# Patient Record
Sex: Female | Born: 1965
Health system: Southern US, Community
[De-identification: ages and names within clinical notes are randomized; demographics above are authoritative.]

## PROBLEM LIST (undated history)

## (undated) DIAGNOSIS — Z9889 Other specified postprocedural states: Secondary | ICD-10-CM

## (undated) DIAGNOSIS — I10 Essential (primary) hypertension: Secondary | ICD-10-CM

## (undated) DIAGNOSIS — W57XXXA Bitten or stung by nonvenomous insect and other nonvenomous arthropods, initial encounter: Secondary | ICD-10-CM

## (undated) DIAGNOSIS — E7800 Pure hypercholesterolemia, unspecified: Secondary | ICD-10-CM

## (undated) DIAGNOSIS — R600 Localized edema: Secondary | ICD-10-CM

## (undated) DIAGNOSIS — R112 Nausea with vomiting, unspecified: Secondary | ICD-10-CM

## (undated) DIAGNOSIS — E782 Mixed hyperlipidemia: Principal | ICD-10-CM

## (undated) DIAGNOSIS — K5900 Constipation, unspecified: Secondary | ICD-10-CM

## (undated) DIAGNOSIS — E559 Vitamin D deficiency, unspecified: Secondary | ICD-10-CM

## (undated) DIAGNOSIS — E669 Obesity, unspecified: Secondary | ICD-10-CM

## (undated) DIAGNOSIS — B029 Zoster without complications: Secondary | ICD-10-CM

## (undated) DIAGNOSIS — F419 Anxiety disorder, unspecified: Secondary | ICD-10-CM

## (undated) DIAGNOSIS — R7309 Other abnormal glucose: Secondary | ICD-10-CM

## (undated) DIAGNOSIS — L719 Rosacea, unspecified: Secondary | ICD-10-CM

## (undated) DIAGNOSIS — F99 Mental disorder, not otherwise specified: Secondary | ICD-10-CM

## (undated) HISTORY — DX: Anxiety disorder, unspecified: F41.9

## (undated) HISTORY — DX: Vitamin D deficiency, unspecified: E55.9

## (undated) HISTORY — DX: Mental disorder, not otherwise specified: F99

## (undated) HISTORY — DX: Obesity, unspecified: E66.9

## (undated) HISTORY — PX: CERVICAL DISCECTOMY: SHX98

## (undated) HISTORY — PX: BUNIONECTOMY: SHX129

## (undated) HISTORY — PX: ABDOMINAL HYSTERECTOMY: SHX81

## (undated) HISTORY — DX: Bitten or stung by nonvenomous insect and other nonvenomous arthropods, initial encounter: W57.XXXA

## (undated) HISTORY — DX: Other abnormal glucose: R73.09

## (undated) HISTORY — DX: Constipation, unspecified: K59.00

## (undated) HISTORY — PX: OTHER SURGICAL HISTORY: SHX169

## (undated) HISTORY — DX: Rosacea, unspecified: L71.9

## (undated) HISTORY — DX: Zoster without complications: B02.9

## (undated) HISTORY — DX: Pure hypercholesterolemia, unspecified: E78.00

## (undated) HISTORY — DX: Mixed hyperlipidemia: E78.2

## (undated) HISTORY — PX: COLONOSCOPY: SHX174

## (undated) HISTORY — PX: LUMBAR DISC SURGERY: SHX700

---

## 2002-03-08 ENCOUNTER — Ambulatory Visit (HOSPITAL_COMMUNITY): Admission: RE | Admit: 2002-03-08 | Discharge: 2002-03-08 | Payer: Self-pay | Admitting: Obstetrics and Gynecology

## 2002-03-08 ENCOUNTER — Encounter: Payer: Self-pay | Admitting: Obstetrics and Gynecology

## 2002-03-12 ENCOUNTER — Inpatient Hospital Stay (HOSPITAL_COMMUNITY): Admission: RE | Admit: 2002-03-12 | Discharge: 2002-03-14 | Payer: Self-pay | Admitting: Obstetrics and Gynecology

## 2003-03-27 ENCOUNTER — Ambulatory Visit (HOSPITAL_COMMUNITY): Admission: RE | Admit: 2003-03-27 | Discharge: 2003-03-27 | Payer: Self-pay | Admitting: Obstetrics and Gynecology

## 2003-03-27 ENCOUNTER — Encounter: Payer: Self-pay | Admitting: Obstetrics and Gynecology

## 2004-03-24 ENCOUNTER — Ambulatory Visit (HOSPITAL_COMMUNITY): Admission: RE | Admit: 2004-03-24 | Discharge: 2004-03-24 | Payer: Self-pay | Admitting: Obstetrics and Gynecology

## 2005-03-01 ENCOUNTER — Emergency Department (HOSPITAL_COMMUNITY): Admission: EM | Admit: 2005-03-01 | Discharge: 2005-03-01 | Payer: Self-pay | Admitting: Emergency Medicine

## 2005-03-08 ENCOUNTER — Ambulatory Visit (HOSPITAL_COMMUNITY): Admission: RE | Admit: 2005-03-08 | Discharge: 2005-03-09 | Payer: Self-pay | Admitting: Neurosurgery

## 2005-04-14 ENCOUNTER — Ambulatory Visit (HOSPITAL_COMMUNITY): Admission: RE | Admit: 2005-04-14 | Discharge: 2005-04-14 | Payer: Self-pay | Admitting: Obstetrics and Gynecology

## 2006-04-15 ENCOUNTER — Ambulatory Visit (HOSPITAL_COMMUNITY): Admission: RE | Admit: 2006-04-15 | Discharge: 2006-04-15 | Payer: Self-pay | Admitting: Obstetrics and Gynecology

## 2007-10-23 ENCOUNTER — Ambulatory Visit (HOSPITAL_COMMUNITY): Admission: RE | Admit: 2007-10-23 | Discharge: 2007-10-23 | Payer: Self-pay | Admitting: Obstetrics and Gynecology

## 2008-10-28 ENCOUNTER — Ambulatory Visit (HOSPITAL_COMMUNITY): Admission: RE | Admit: 2008-10-28 | Discharge: 2008-10-28 | Payer: Self-pay | Admitting: Obstetrics and Gynecology

## 2010-11-21 ENCOUNTER — Encounter: Payer: Self-pay | Admitting: Obstetrics & Gynecology

## 2010-11-22 ENCOUNTER — Encounter: Payer: Self-pay | Admitting: Obstetrics and Gynecology

## 2011-03-19 NOTE — H&P (Signed)
[facility]  Patient:    Alicia Gross, FISCHLER Visit Number: [id number] MRN: [id number]          Service Type: OUT Location: RAD Attending Physician:  Tilda Burrow Dictated by:   Christin Bach, M.D. Admit Date:  03/08/2002   CC:         [name], M.D.,  [facility]   History and Physical  ADMITTING DIAGNOSIS:  Bilateral ovarian cysts with enlarged left ovary 9 cm diameter with internal septations, rule out ovarian malignancy, rule out endometriosis.  HISTORY OF PRESENT ILLNESS:  This 45 year old female, gravida 2, para 2, status post tubal ligation, status post cystectomy in 1987, is admitted at this time for abdominal hysterectomy and bilateral removal of ovaries for bilateral ovarian tumors.  The patient was seen for her usual annual visit on Mar 06, 2002, where complaints of several weeks of lower abdominal and pelvic pressure were evaluated.  She was found on exam to have fullness in the adnexa as well as right adnexal tenderness.  Vaginal probe ultrasound shows a 9 x 10-cm left ovarian cyst.  She also complained of loss of urine with coughing, sneezing and laughing.  A CA 125 was ordered and is at the upperlimits of normal at 27 (normal 0 to 30).  CT of the abdomen shows a left ovarian cyst with two internal septations, 6.4 x 8.2 x 9.0 cm by CT with no nodularities identified.  The right ovary has a simple cyst 3.6 cm.  There is no adenopathy and no ascites.  There is a small amount of pelvic fluid considered within normal limits.  Upper abdomen is similarly negative on CT. The patient has been made fully aware that frozen section will be necessary to evaluate the potential for malignancy in the ovaries.  The potential for need for additional surgeries if cancer is identified has been discussed at length. The patient is fully aware of risks and declines the option of referral to tertiary centers or GYN oncology for this  work.  Additionally, the patient has been evaluated this week for the problems of stress incontinence and has been found to have documentable stress incontinence symptomatology by Dr. Rito Ehrlich.  The plans are to do a Burch urethropexy at the time of the surgery; this will be performed as a concurrent procedure.  FURTHER REVIEW OF SYSTEMS:  Includes mild cystic acne of the face recently treated with doxycycline, diuretic p.r.n. dependent edema chronically used.  ALLERGIES:  None known.  SURGICAL HISTORY: 1. Exploratory laparotomy while pregnancy in 1987 for a right ovarian    cystectomy. 2. In 1996 bunionectomy, right foot.  MEDICATIONS: 1. Lasix p.r.n. 2. Tetracycline. 3. Lortab or Lorabid p.r.n. discomfort. 4. Prempro at menses to reduce flow.  LABORATORY DATA:  Pap smear class I.  PHYSICAL EXAMINATION:  GENERAL:  Shows a healthy Caucasian female alert and oriented x3.  VITAL SIGNS:  Weight 222.  Blood pressure 118/80, pulse 70.  HEENT:  Within normal limits.  Oral hygiene present.  NECK:  Trachea midline, thyroid palpates as normal.  BREAST EXAM:  No masses, dimpling or retraction.  CARDIOVASCULAR EXAM:  Regular rate and rhythm.  LUNGS:  Clear to auscultation.  ABDOMEN:  Flat, soft, nontender, midline vertical lower abdominal scar with stretch marks and laxity from childbearing.  MUSCULOSKELETAL:  No obvious joint deformities.  EXTREMITIES:  Within normal limits, 2+ DTRs.  GENITOURINARY:  External genitalia normal to appearance with the anterior urethra prominent beneath the symphysis  pubis.  There is general adnexal tenderness with vaginal probe ultrasound and CT as mentioned in the HPI.  Loss of urine with coughing or sneezing, laughing not reproducible but consistent on patient history.  PLAN: 1. Laparotomy. 2. Wide excision of abdominal cicatrix. 3. Abdominal hysterectomy. 4. Probable bilateral salpingo-oophorectomy with frozen section of left     ovary. 5. Burch urethropexy at time of procedure. 6. If frozen section suggests a malignancy, omentectomy and additional    biopsies as indicated. Dictated by:   Christin Bach, M.D. Attending Physician:  Tilda Burrow DD:  03/09/02 TD:  03/09/02 Job: [id number]

## 2011-03-19 NOTE — Op Note (Signed)
NAMEAINA, ROSSBACH NO.:  [id number]   MEDICAL RECORD NO.:  [id number]          PATIENT TYPE:  OIB   LOCATION:  3036                         FACILITY:  [facility]   PHYSICIAN:  Cristi Loron, M.D.DATE OF BIRTH:  1966/10/07   DATE OF PROCEDURE:  03/08/2005  DATE OF DISCHARGE:                                 OPERATIVE REPORT   BRIEF HISTORY:  The patient is a 45 year old white female who suffers from  neck and left arm pain.  She has failed medical management and was worked up  with a cervical MRI which demonstrated a large herniated disc at C6-C7 on  the left as well as significant spondylosis and stenosis at C5-C6.  I  discussed the various treatment options with the patient including surgery.  The patient has weighed the risks, benefits, and alternatives of surgery and  decided to proceed with a C5-C6 and C6-C7 anterior cervical discectomy,  fusion, and plating.   PREOPERATIVE DIAGNOSIS:  C5-C6 spondylosis and stenosis, C6-C7 herniated  nucleus pulposus, cervical radiculopathy, cervicalgia.   POSTOPERATIVE DIAGNOSIS:  C5-C6 spondylosis and stenosis, C6-C7 herniated  nucleus pulposus, cervical radiculopathy, cervicalgia.   PROCEDURE:  C5-C6 and C6-C7 extensive anterior cervical  discectomy/decompression; interbody iliac crest allograft arthrodesis;  anterior cervical plating C5 through C7 (Codman Slimlock titanium plate and  screws).   SURGEON:  Cristi Loron, M.D.   ASSISTANT:  Hewitt Shorts, M.D.   ANESTHESIA:  General endotracheal anesthesia.   ESTIMATED BLOOD LOSS:  100 mL.   SPECIMENS:  None.   DRAINS:  None.   COMPLICATIONS:  None.   DESCRIPTION OF PROCEDURE:  The patient was brought to the operating room by  the anesthesia team.  General endotracheal anesthesia was induced.  The  patient's neck was then prepared with Betadine scrub and Betadine solution.  Sterile drapes were applied.  I then injected the area to be incised with  Marcaine with epinephrine solution.  I used a scalpel to make a transverse  incision in the patient's left anterior neck.  I used the Metzenbaum  scissors to divide the platysmal muscle and to dissect medial to the  sternocleidomastoid muscle, jugular vein, and carotid artery.  I bluntly  dissected down towards the anterior cervical spine, carefully identified the  esophagus and retracted it medially.  I used Kitner swabs to clear soft  tissue from the anterior cervical spine and then inserted a bent spinal  needle into the upper exposed interspace.  I obtained an interoperative  radiograph to confirm our location.   We then used electrocautery to detach the medial border of the longus colli  muscles bilaterally from C5-C6 and C6-C7 intervertebral disc space.  We  inserted the Caspar self-retaining retractor for exposure.  We began at C6-  C7.  We incised the C6-C7 intervertebral disc and performed a partial  discectomy using Carlens curets and pituitary forceps.  We then inserted  distraction screws at C6 and C7 and distracted the interspace.  We used the  high speed drill to decorticate the vertebral endplates at C6-C7, drill away  the remainder of  the C6-C7 intervertebral disc, and then drilled away the  posterior spondylosis.  I then used the arachnoid knife to incise the  thinned out posterior longitudinal ligament.  I then removed it with the  Kerrison punch under cutting the vertebral endplate at C6-C7, decompressing  the thecal sac.  I then performed a foraminotomy about the bilateral C7  nerve roots.  Of note, on the left side, there was a moderately large  herniated disc compressing the left C7 nerve root.  We decompressed both  nerve roots well.   We then repeated the procedure in an analogous fashion at C5-C6  decompressing the thecal sac and bilateral C6 nerve root.  The only  difference at this level was that there was more spondylosis.   Having completed the  decompressions at C5-C6 and C6-C7, we now turned our  attention to the arthrodesis.  We obtained iliac crest tricortical allograft  bone graft and fashioned it to these approximate dimensions, 7 mm in height,  1 cm in depth, inserted the bone graft and distracted the C5-C6 interspace  and removed the distraction screws from C5, placed it back into C7,  distracted the C6-C7 interspace, and placed a second bone graft in that  interspace.  We removed the distraction screws and there was a good snug fit  of bone at both levels.   We now turned our attention to the anterior spine instrumentation.  We used  a high speed drill to remove some ventral spondylosis from the vertebral  endplates of C5-C6 and C6-C7 so that the plate would lie flat.  We selected  the appropriate length Codman Slimlock titanium plate, we laid it on the  anterior aspect of the vertebral bodies from C5 to C7, we drilled two 12 mm  holes at C5, C6, and C7.  We secured the plate to the vertebral bodies by  placing two 12 mm self-tapping screws at C6, C5, and C7.  We obtained an  interoperative radiograph.  We could not see the plate and screws well  because of the patient's body habitus, but they looked good en vivo.  We  secured the screws to the plate by locking each cam.   We then obtained stringent hemostasis using bipolar electrocautery.  We then  removed the Caspar self-retaining retractor.  We inspected the esophagus for  any damage and there was none apparent.  We then reapproximated the  patient's platysmal muscle with interrupted 3-0 Vicryl suture, the  subcutaneous tissue with interrupted 3-0 Vicryl suture, and the skin with  Steri-Strips and Benzoin.  The wound was then coated with Bacitracin  ointment.  A sterile dressing was applied, the drapes were removed.  The  patient was subsequently extubated by the anesthesia team and transported to the post anesthesia care unit in stable condition.  All sponge,  instrument  and needle counts were correct at the end of the case.      JDJ/MEDQ  D:  03/08/2005  T:  03/08/2005  Job:  [id number]

## 2011-03-19 NOTE — Op Note (Signed)
[facility]  Patient:    Alicia Gross, Alicia Gross Visit Number: [id number] MRN: [id number]          Service Type: MED Location: 4A A417 01 Attending Physician:  Tilda Burrow Dictated by:   Dennie Maizes, M.D. Proc. Date: 03/12/02 Admit Date:  03/12/2002 Discharge Date: 03/14/2002   CC:         Christin Bach, M.D.   Operative Report  PREOPERATIVE DIAGNOSES:   Stress urinary incontinence.  POSTOPERATIVE DIAGNOSES: Stress urinary incontinence.  OPERATION:  Burch sacrourethropexy.  SURGEON:  Dennie Maizes, M.D.  ASSISTANT:  Christin Bach, M.D.  ANESTHESIA:  General.  COMPLICATIONS:  None.  DRAINS:  32-French triple lumen Foley catheter with 30 cc balloon in the bladder.  INDICATIONS:  This 45 year old female with excessive menstrual periods is scheduled to undergo total abdominal hysterectomy with bilateral salpingo-oophorectomy.  She had significant stress urinary incontinence. After urological evaluation, she was scheduled to undergo urethropexy under the same anesthesia.  DESCRIPTION OF PROCEDURE:  General anesthesia was induced, and the patient was placed on the OR table in the supine position.  Her legs were kept in a frog leg position for access to the vagina.  The abdomen and genitalia were prepped and draped in a sterile fashion.  A 22-French triple lumen Foley catheter with a 30 cc balloon was then inserted into the bladder under sterile precautions.  Through a midline abdominal incision, Dr. Emelda Fear proceeded with total abdominal hysterectomy and bilateral salpingo-oophorectomy.  The peritoneum was closed after the procedure.  The presacral space was then entered and the bladder was mobilized in anterior as well as lateral aspects.  The ______  was placed in the vagina, and the lateral vaginal wall was elevated.  By palpating the Foley balloon, the edges of the bladder were deferred.  The sutures were placed on the right  side first.  The lateral part of the bladder was deferred and rolled medially. Adipose tissue over the endopelvic fascia was then teased and removed.  The anterolateral vaginal wall was then approximated to the Coopers ligament using a 0 Prolene suture.  A double bite was taken over the vaginal wall and endopelvic fascia.  A second suture was placed on the left side in a similar fashion.  Elevation of both sutures resulted in lengthening of the urethra and elevation of the bladder base.  The sutures were then tied snugly with elevation of the vaginal wall.  There was no active bleeding at this time. The abdominal incision was then closed by Dr. Emelda Fear. Dictated by:   Dennie Maizes, M.D. Attending Physician:  Tilda Burrow DD:  03/13/02 TD:  03/14/02 Job: [id number]

## 2011-03-19 NOTE — Discharge Summary (Signed)
[facility]  Patient:    Alicia Gross, Alicia Gross Visit Number: [id number] MRN: [id number]          Service Type: MED Location: 4A A417 01 Attending Physician:  Tilda Burrow Dictated by:   Christin Bach, M.D. Admit Date:  03/12/2002 Discharge Date: 03/14/2002                             Discharge Summary  ADMITTING DIAGNOSES: 1. Bilateral ovarian cyst. 2. Rule out ovarian malignancy. 3. Rule out endometriosis. 4. Stress urinary incontinence.  DISCHARGE DIAGNOSES: 1. Endometriosis with bilateral endometriomas. 2. Stress urinary incontinence.  PROCEDURES: 1. Total abdominal hysterectomy, bilateral salpingo-oophorectomy on Mar 12, 2002. 2. Burch urethropexy on Mar 12, 2002.  DISCHARGE MEDICATIONS: 1. Tylox one q.4 h. p.r.n. pain, 20 tablets. 2. Motrin 800 mg one q.8 h. for pain routinely, 30 tablets. 3. Cipro 500 mg twice daily x1 week. 4. Estraderm 0.1 mg patches use weekly.  FOLLOWUP:  One week for staple removal.  HISTORY OF PRESENT ILLNESS:  This 45 year old female was admitted for exploratory laparotomy, removal of large cystic ovaries which were both symptomatic and suspicious for the presence of large cystic masses, internal, within the left ovary with CA125 within the upper limits of normal. Additionally, she had a stress incontinence and was evaluated for this preoperatively by Dr. Rito Ehrlich, and Burch urethropexy was planned as in preoperative notes. Abdominal prior cicatrix from right cystectomy in the past will be trimmed.  HOSPITAL COURSE:  The patients TAHBSO, wide excision of cicatrix, and Burch urethropexy were performed without difficulty on Mar 12, 2002. Postoperatively, the patient showed mild nausea for 24 hours with required Dilaudid PCA pump for analgesia for 48 hours. Tolerated the diet adequately to be discharged home at 48 hours. Postoperative hemoglobin 11.3 compared to 13.8 preoperatively. The patient was discharged  home on Mar 14, 2002 for followup one week. Dictated by:   Christin Bach, M.D. Attending Physician:  Tilda Burrow DD:  03/14/02 TD:  03/16/02 Job: [id number]

## 2011-08-19 ENCOUNTER — Other Ambulatory Visit: Payer: Self-pay | Admitting: Obstetrics and Gynecology

## 2011-08-19 DIAGNOSIS — Z139 Encounter for screening, unspecified: Secondary | ICD-10-CM

## 2011-08-23 ENCOUNTER — Ambulatory Visit (HOSPITAL_COMMUNITY)
Admission: RE | Admit: 2011-08-23 | Discharge: 2011-08-23 | Disposition: A | Discharge status: Home / Self Care | Payer: 59 | Source: Ambulatory Visit | Attending: Obstetrics and Gynecology | Admitting: Obstetrics and Gynecology

## 2011-08-23 DIAGNOSIS — Z139 Encounter for screening, unspecified: Secondary | ICD-10-CM

## 2011-08-23 DIAGNOSIS — Z1231 Encounter for screening mammogram for malignant neoplasm of breast: Secondary | ICD-10-CM | POA: Insufficient documentation

## 2012-10-18 ENCOUNTER — Other Ambulatory Visit: Payer: Self-pay | Admitting: Obstetrics and Gynecology

## 2012-10-18 DIAGNOSIS — Z09 Encounter for follow-up examination after completed treatment for conditions other than malignant neoplasm: Secondary | ICD-10-CM

## 2013-04-23 ENCOUNTER — Other Ambulatory Visit: Payer: Self-pay | Admitting: Adult Health

## 2013-05-29 ENCOUNTER — Other Ambulatory Visit: Payer: Self-pay | Admitting: Obstetrics and Gynecology

## 2013-05-29 DIAGNOSIS — Z139 Encounter for screening, unspecified: Secondary | ICD-10-CM

## 2013-06-04 ENCOUNTER — Ambulatory Visit (HOSPITAL_COMMUNITY)
Admission: RE | Admit: 2013-06-04 | Discharge: 2013-06-04 | Disposition: A | Discharge status: Home / Self Care | Payer: 59 | Source: Ambulatory Visit | Attending: Obstetrics and Gynecology | Admitting: Obstetrics and Gynecology

## 2013-06-04 DIAGNOSIS — Z1231 Encounter for screening mammogram for malignant neoplasm of breast: Secondary | ICD-10-CM | POA: Insufficient documentation

## 2013-06-04 DIAGNOSIS — Z139 Encounter for screening, unspecified: Secondary | ICD-10-CM

## 2013-10-22 ENCOUNTER — Other Ambulatory Visit: Payer: Self-pay | Admitting: Adult Health

## 2013-10-22 MED ORDER — HYDROCHLOROTHIAZIDE 25 MG PO TABS: 25.0000 mg | ORAL_TABLET | Freq: Every day | ORAL | Status: DC

## 2013-10-22 MED ORDER — ALPRAZOLAM 1 MG PO TABS: ORAL_TABLET | ORAL | Status: DC

## 2013-10-30 ENCOUNTER — Ambulatory Visit (INDEPENDENT_AMBULATORY_CARE_PROVIDER_SITE_OTHER): Payer: 59 | Admitting: Adult Health

## 2013-10-30 ENCOUNTER — Encounter: Payer: Self-pay | Admitting: Adult Health

## 2013-10-30 ENCOUNTER — Encounter (INDEPENDENT_AMBULATORY_CARE_PROVIDER_SITE_OTHER): Payer: Self-pay

## 2013-10-30 VITALS — BP 130/84 | HR 74 | Ht 66.75 in | Wt 224.6 lb

## 2013-10-30 DIAGNOSIS — F329 Major depressive disorder, single episode, unspecified: Secondary | ICD-10-CM | POA: Insufficient documentation

## 2013-10-30 DIAGNOSIS — Z1212 Encounter for screening for malignant neoplasm of rectum: Secondary | ICD-10-CM

## 2013-10-30 DIAGNOSIS — Z01419 Encounter for gynecological examination (general) (routine) without abnormal findings: Secondary | ICD-10-CM

## 2013-10-30 DIAGNOSIS — F419 Anxiety disorder, unspecified: Secondary | ICD-10-CM

## 2013-10-30 DIAGNOSIS — F32A Depression, unspecified: Secondary | ICD-10-CM | POA: Insufficient documentation

## 2013-10-30 DIAGNOSIS — E7800 Pure hypercholesterolemia, unspecified: Secondary | ICD-10-CM | POA: Insufficient documentation

## 2013-10-30 LAB — CBC
HCT: 41.5 % (ref 36.0–46.0)
Hemoglobin: 14.5 g/dL (ref 12.0–15.0)
MCH: 31.2 pg (ref 26.0–34.0)
MCV: 89.2 fL (ref 78.0–100.0)
Platelets: 225 10*3/uL (ref 150–400)
RBC: 4.65 MIL/uL (ref 3.87–5.11)
RDW: 13.3 % (ref 11.5–15.5)

## 2013-10-30 LAB — LIPID PANEL
Cholesterol: 130 mg/dL (ref 0–200)
HDL: 37 mg/dL — ABNORMAL LOW (ref >39)
LDL Cholesterol: 25 mg/dL (ref 0–99)

## 2013-10-30 LAB — COMPREHENSIVE METABOLIC PANEL
ALT: 18 U/L (ref 0–35)
AST: 16 U/L (ref 0–37)
Albumin: 4.6 g/dL (ref 3.5–5.2)
BUN: 15 mg/dL (ref 6–23)
Calcium: 9.9 mg/dL (ref 8.4–10.5)
Glucose, Bld: 95 mg/dL (ref 70–99)
Potassium: 4.1 mEq/L (ref 3.5–5.3)
Sodium: 138 mEq/L (ref 135–145)
Total Protein: 7 g/dL (ref 6.0–8.3)

## 2013-10-30 LAB — HEMOCCULT GUIAC POC 1CARD (OFFICE)

## 2013-10-30 NOTE — Patient Instructions (Signed)
Physical in 1 year Mammogram yearly Colonoscopy at 48 Call for labs in 48 hours

## 2013-10-30 NOTE — Progress Notes (Signed)
Patient ID: Alicia Gross, female   DOB: [birth year], 47 y.o.   MRN: [id number] History of Present Illness: Alicia Gross is a 47 year old white female,married in for a physical.She has noticed trouble focusing,has new job working at home.   Current Medications, Allergies, Past Medical History, Past Surgical History, Family History and Social History were reviewed in [employer] record.   Past Medical History  Diagnosis Date  . Constipation   . Anxiety   . Elevated cholesterol    Past Surgical History  Procedure Laterality Date  . Abdominal hysterectomy    . Bunionectomy    . Lumbar disc surgery    Current outpatient prescriptions:ALPRAZolam (XANAX) 1 MG tablet, TAKE 1/2 TO 1 TABLET BY MOUTH EVERY 8 HOURS AS NEEDED FOR ANXIETY, Disp: 60 tablet, Rfl: 2;  buPROPion (WELLBUTRIN SR) 150 MG 12 hr tablet, Take 150 mg by mouth 2 (two) times daily., Disp: , Rfl: ;  Cyanocobalamin (VITAMIN B 12 PO), Take 1,000 mg by mouth daily., Disp: , Rfl: ;  FIBER PO, Take 1,200 mg by mouth daily., Disp: , Rfl:  hydrochlorothiazide (HYDRODIURIL) 25 MG tablet, Take 1 tablet (25 mg total) by mouth daily., Disp: 90 tablet, Rfl: 4;  Multiple Vitamin (MULTIVITAMIN) tablet, Take 1 tablet by mouth daily., Disp: , Rfl: ;  Omega-3 Fatty Acids (FISH OIL PO), Take 2,000 mg by mouth daily., Disp: , Rfl: ;  OVER THE COUNTER MEDICATION, Stool softner 300 mg, Disp: , Rfl: ;  pravastatin (PRAVACHOL) 40 MG tablet, Take 40 mg by mouth daily., Disp: , Rfl:   Review of Systems: Patient denies any headaches, blurred vision, shortness of breath, chest pain, abdominal pain, problems with bowel movements, urination, or intercourse. Has constipation at times, left knee aches at times,no mood swings.See HPI.    Physical Exam:BP 130/84  Pulse 74  Ht 5' 6.75" (1.695 m)  Wt 224 lb 9.6 oz (101.878 kg)  BMI 35.46 kg/m2 General:  Well developed, well nourished, no acute distress Skin:  Warm and dry Neck:  Midline trachea,  normal thyroid Lungs; Clear to auscultation bilaterally Breast:  No dominant palpable mass, retraction, or nipple discharge Cardiovascular: Regular rate and rhythm Abdomen:  Soft, non tender, no hepatosplenomegaly Pelvic:  External genitalia is normal in appearance.  The vagina is normal in appearance, has some relaxation, cervix and uterus are absent. No adnexal masses or tenderness noted. Rectal: Good sphincter tone, no polyps,has internal hemorrhoids.  Hemoccult negative. Extremities: some swelling left leg Psych:  No mood changes,alerta and cooperative,seems happy   Impression: Yearly gyn exam no pap Elevated cholesterol Anxiety     Plan: Physical in 1 year Mammogram yearly  Check CBC,CMP,TSH and lipids Colonoscopy at 48  Follow up labs in 48 hours Try to take breaks and increase activity to see if helps with focusing, if not may try low dose adderal

## 2013-10-31 ENCOUNTER — Telehealth: Payer: Self-pay | Admitting: Adult Health

## 2013-10-31 NOTE — Telephone Encounter (Signed)
Left message to call about labs, trig elevated,

## 2013-11-02 ENCOUNTER — Other Ambulatory Visit: Payer: Self-pay | Admitting: Adult Health

## 2013-11-02 MED ORDER — FENOFIBRATE 48 MG PO TABS: 48.0000 mg | ORAL_TABLET | Freq: Every day | ORAL | Status: DC

## 2013-11-02 MED ORDER — FENOFIBRATE 54 MG PO TABS: 54.0000 mg | ORAL_TABLET | Freq: Every day | ORAL | Status: DC

## 2013-12-28 ENCOUNTER — Other Ambulatory Visit: Payer: 59

## 2014-02-07 ENCOUNTER — Other Ambulatory Visit: Payer: Self-pay | Admitting: Adult Health

## 2014-02-07 MED ORDER — CYCLOBENZAPRINE HCL 10 MG PO TABS: 10.0000 mg | ORAL_TABLET | Freq: Three times a day (TID) | ORAL | Status: DC | PRN

## 2014-02-07 NOTE — Telephone Encounter (Signed)
Having back spasms will rx flexeril

## 2014-02-08 ENCOUNTER — Other Ambulatory Visit: Payer: 59

## 2014-02-08 DIAGNOSIS — E782 Mixed hyperlipidemia: Secondary | ICD-10-CM

## 2014-02-08 DIAGNOSIS — Z0000 Encounter for general adult medical examination without abnormal findings: Secondary | ICD-10-CM

## 2014-02-08 LAB — LIPID PANEL
Cholesterol: 135 mg/dL (ref 0–200)
HDL: 34 mg/dL — ABNORMAL LOW (ref >39)
LDL Cholesterol: 52 mg/dL (ref 0–99)
Total CHOL/HDL Ratio: 4 Ratio
Triglycerides: 244 mg/dL — ABNORMAL HIGH (ref <150)
VLDL: 49 mg/dL — ABNORMAL HIGH (ref 0–40)

## 2014-02-08 LAB — COMPREHENSIVE METABOLIC PANEL
ALT: 21 U/L (ref 0–35)
AST: 18 U/L (ref 0–37)
Albumin: 4.2 g/dL (ref 3.5–5.2)
Alkaline Phosphatase: 72 U/L (ref 39–117)
BILIRUBIN TOTAL: 0.5 mg/dL (ref 0.2–1.2)
BUN: 12 mg/dL (ref 6–23)
CHLORIDE: 99 meq/L (ref 96–112)
CO2: 27 mEq/L (ref 19–32)
CREATININE: 0.77 mg/dL (ref 0.50–1.10)
Calcium: 9.8 mg/dL (ref 8.4–10.5)
GLUCOSE: 95 mg/dL (ref 70–99)
Potassium: 4 mEq/L (ref 3.5–5.3)
SODIUM: 140 meq/L (ref 135–145)
TOTAL PROTEIN: 6.8 g/dL (ref 6.0–8.3)

## 2014-02-08 NOTE — Addendum Note (Signed)
Addended by: Colen DarlingYOUNG, JANET S on: 02/08/2014 09:26 AM   Modules accepted: Orders

## 2014-02-12 ENCOUNTER — Telehealth: Payer: Self-pay | Admitting: Adult Health

## 2014-02-12 NOTE — Telephone Encounter (Signed)
Left message to call about labs 

## 2014-02-13 ENCOUNTER — Telehealth: Payer: Self-pay | Admitting: Adult Health

## 2014-02-13 NOTE — Telephone Encounter (Signed)
Pt aware of labs, continue meds,take 3 gms krell oil in divided dose and increase activity and NO carbs and recheck labs in 12 weeks,had discussed with Dr Despina HiddenEure

## 2014-03-13 ENCOUNTER — Other Ambulatory Visit: Payer: Self-pay | Admitting: Adult Health

## 2014-04-11 ENCOUNTER — Other Ambulatory Visit: Payer: Self-pay | Admitting: Adult Health

## 2014-07-12 ENCOUNTER — Other Ambulatory Visit: Payer: Self-pay | Admitting: Adult Health

## 2014-07-12 ENCOUNTER — Telehealth: Payer: Self-pay | Admitting: Adult Health

## 2014-07-12 NOTE — Telephone Encounter (Signed)
Has had swelling in both legs, had injury right knee in past and it swells, increase HCTZ to 50 mg for 5-6 days if not better make appt,has gained weight will feel Short of breath at times.

## 2014-07-25 ENCOUNTER — Other Ambulatory Visit: Payer: Self-pay | Admitting: Obstetrics and Gynecology

## 2014-07-25 DIAGNOSIS — Z1231 Encounter for screening mammogram for malignant neoplasm of breast: Secondary | ICD-10-CM

## 2014-07-29 ENCOUNTER — Ambulatory Visit (HOSPITAL_COMMUNITY)
Admission: RE | Admit: 2014-07-29 | Discharge: 2014-07-29 | Disposition: A | Discharge status: Home / Self Care | Payer: 59 | Source: Ambulatory Visit | Attending: Obstetrics and Gynecology | Admitting: Obstetrics and Gynecology

## 2014-07-29 DIAGNOSIS — Z1231 Encounter for screening mammogram for malignant neoplasm of breast: Secondary | ICD-10-CM | POA: Diagnosis not present

## 2014-08-01 ENCOUNTER — Other Ambulatory Visit: Payer: Self-pay | Admitting: Adult Health

## 2014-08-01 MED ORDER — HYDROCHLOROTHIAZIDE 50 MG PO TABS: 50.0000 mg | ORAL_TABLET | Freq: Every day | ORAL | Status: DC

## 2014-08-21 ENCOUNTER — Other Ambulatory Visit: Payer: Self-pay | Admitting: *Deleted

## 2014-08-21 MED ORDER — ALPRAZOLAM 1 MG PO TABS: ORAL_TABLET | ORAL | Status: DC

## 2014-09-02 ENCOUNTER — Encounter: Payer: Self-pay | Admitting: Adult Health

## 2014-09-09 ENCOUNTER — Ambulatory Visit (INDEPENDENT_AMBULATORY_CARE_PROVIDER_SITE_OTHER): Payer: 59 | Admitting: Adult Health

## 2014-09-09 ENCOUNTER — Encounter: Payer: Self-pay | Admitting: Adult Health

## 2014-09-09 VITALS — BP 128/80 | HR 78 | Ht 67.0 in | Wt 225.5 lb

## 2014-09-09 DIAGNOSIS — Z01419 Encounter for gynecological examination (general) (routine) without abnormal findings: Secondary | ICD-10-CM

## 2014-09-09 DIAGNOSIS — Z1212 Encounter for screening for malignant neoplasm of rectum: Secondary | ICD-10-CM

## 2014-09-09 DIAGNOSIS — E669 Obesity, unspecified: Secondary | ICD-10-CM

## 2014-09-09 DIAGNOSIS — E7800 Pure hypercholesterolemia, unspecified: Secondary | ICD-10-CM

## 2014-09-09 DIAGNOSIS — Z139 Encounter for screening, unspecified: Secondary | ICD-10-CM

## 2014-09-09 HISTORY — DX: Obesity, unspecified: E66.9

## 2014-09-09 LAB — HEMOCCULT GUIAC POC 1CARD (OFFICE): FECAL OCCULT BLD: NEGATIVE

## 2014-09-09 MED ORDER — PHENTERMINE HCL 37.5 MG PO CAPS: 37.5000 mg | ORAL_CAPSULE | ORAL | Status: DC

## 2014-09-09 NOTE — Progress Notes (Signed)
Patient ID: Alicia Gross, female   DOB: October 18, 1966, 48 y.o.   MRN: [id number] History of Present Illness: [name] is a 48 year old white female,married, in for a gyn physical.She complains of not being able to lose weight and has discomfort in lower back at times and in hips.She is sitting a lot, she works from home.She stopped cholesterol meds due to body aches.Since increasing HCTZ to 50 mg feels better with less swelling at end of day and less leg discomfort.   Current Medications, Allergies, Past Medical History, Past Surgical History, Family History and Social History were [facility].     Review of Systems: Patient denies any headaches, blurred vision, shortness of breath, chest pain, abdominal pain, problems with bowel movements, urination, or intercourse. No mood swings, see HPI for positives.    Physical Exam:BP 128/80 mmHg  Pulse 78  Ht 5\' 7"  (1.702 m)  Wt 225 lb 8 oz (102.286 kg)  BMI 35.31 kg/m2 General:  Well developed, well nourished, no acute distress Skin:  Warm and dry Neck:  Midline trachea, normal thyroid Lungs; Clear to auscultation bilaterally Breast:  No dominant palpable mass, retraction, or nipple discharge Cardiovascular: Regular rate and rhythm Abdomen:  Soft, non tender, no hepatosplenomegaly Pelvic:  External genitalia is normal in appearance, no lesions.  The vagina is pale, with loss of moisture and rugae. The cervix and uterus are absent. Rectal: Good sphincter tone, no polyps, or hemorrhoids felt.  Hemoccult negative. Extremities:  No swelling or varicosities noted Psych:  No mood changes,alert and cooperative,seems happy Discussed trying adipex for weight loss and increasing activity.  Impression: Well woman gyn exam no pap Obesity Elevated cholesterol    Plan: Continue meds, has refills Check CBC,CMP,TSH and lipids and A1c Physical in 1 year Mammogram yearly Referred to Dr Karilyn Cotaehman for screening  colonoscopy Rx adipex 37.5 mgh #30 1 daily no refills  Follow in 4 weeks for BP and weight check Review handout on weight loss and exercise

## 2014-09-09 NOTE — Patient Instructions (Addendum)
Physical in 1 year Mammogram yearly  Referred to Dr Karilyn Cotaehman for colonoscopy Get flu shot Exercise to Lose Weight Exercise and a healthy diet may help you lose weight. Your doctor may suggest specific exercises. EXERCISE IDEAS AND TIPS  Choose low-cost things you enjoy doing, such as walking, bicycling, or exercising to workout videos.  Take stairs instead of the elevator.  Walk during your lunch break.  Park your car further away from work or school.  Go to a gym or an exercise class.  Start with 5 to 10 minutes of exercise each day. Build up to 30 minutes of exercise 4 to 6 days a week.  Wear shoes with good support and comfortable clothes.  Stretch before and after working out.  Work out until you breathe harder and your heart beats faster.  Drink extra water when you exercise.  Do not do so much that you hurt yourself, feel dizzy, or get very short of breath. Exercises that burn about 150 calories:  Running 1  miles in 15 minutes.  Playing volleyball for 45 to 60 minutes.  Washing and waxing a car for 45 to 60 minutes.  Playing touch football for 45 minutes.  Walking 1  miles in 35 minutes.  Pushing a stroller 1  miles in 30 minutes.  Playing basketball for 30 minutes.  Raking leaves for 30 minutes.  Bicycling 5 miles in 30 minutes.  Walking 2 miles in 30 minutes.  Dancing for 30 minutes.  Shoveling snow for 15 minutes.  Swimming laps for 20 minutes.  Walking up stairs for 15 minutes.  Bicycling 4 miles in 15 minutes.  Gardening for 30 to 45 minutes.  Jumping rope for 15 minutes.  Washing windows or floors for 45 to 60 minutes. Document Released: 11/20/2010 Document Revised: 01/10/2012 Document Reviewed: 11/20/2010 [facility] Patient Information 2015 [employer] [place]. This information is not intended to replace advice given to you by your health care provider. Make sure you discuss any questions you have with your health care provider. Follow up  in 4 weeks for BP and weight check

## 2014-09-10 ENCOUNTER — Telehealth: Payer: Self-pay | Admitting: Adult Health

## 2014-09-10 LAB — COMPREHENSIVE METABOLIC PANEL
ALBUMIN: 4.3 g/dL (ref 3.5–5.2)
ALT: 27 U/L (ref 0–35)
AST: 27 U/L (ref 0–37)
Alkaline Phosphatase: 90 U/L (ref 39–117)
BUN: 14 mg/dL (ref 6–23)
CALCIUM: 9.8 mg/dL (ref 8.4–10.5)
CHLORIDE: 101 meq/L (ref 96–112)
CO2: 28 meq/L (ref 19–32)
CREATININE: 0.63 mg/dL (ref 0.50–1.10)
Glucose, Bld: 103 mg/dL — ABNORMAL HIGH (ref 70–99)
POTASSIUM: 3.6 meq/L (ref 3.5–5.3)
Sodium: 140 mEq/L (ref 135–145)
Total Bilirubin: 0.6 mg/dL (ref 0.2–1.2)
Total Protein: 6.5 g/dL (ref 6.0–8.3)

## 2014-09-10 LAB — CBC
HCT: 41 % (ref 36.0–46.0)
Hemoglobin: 14 g/dL (ref 12.0–15.0)
MCH: 30.4 pg (ref 26.0–34.0)
MCHC: 34.6 g/dL (ref 30.0–36.0)
MCV: 89.5 fL (ref 78.0–100.0)
PLATELETS: 298 10*3/uL (ref 150–400)
RBC: 4.61 MIL/uL (ref 3.87–5.11)
RDW: 13.9 % (ref 11.5–15.5)
WBC: 7.3 10*3/uL (ref 4.0–10.5)

## 2014-09-10 LAB — LIPID PANEL
Cholesterol: 155 mg/dL (ref 0–200)
HDL: 34 mg/dL — ABNORMAL LOW (ref >39)
LDL Cholesterol: 67 mg/dL (ref 0–99)
Total CHOL/HDL Ratio: 4.6 Ratio
Triglycerides: 268 mg/dL — ABNORMAL HIGH (ref <150)
VLDL: 54 mg/dL — ABNORMAL HIGH (ref 0–40)

## 2014-09-10 LAB — HEMOGLOBIN A1C
HEMOGLOBIN A1C: 5.9 % — AB (ref <5.7)
Mean Plasma Glucose: 123 mg/dL — ABNORMAL HIGH (ref <117)

## 2014-09-10 LAB — TSH: TSH: 1.604 u[IU]/mL (ref 0.350–4.500)

## 2014-09-10 MED ORDER — FENOFIBRATE 54 MG PO TABS: 54.0000 mg | ORAL_TABLET | Freq: Every day | ORAL | Status: DC

## 2014-09-10 MED ORDER — FENOFIBRATE 48 MG PO TABS: 48.0000 mg | ORAL_TABLET | Freq: Every day | ORAL | Status: DC

## 2014-09-10 NOTE — Telephone Encounter (Signed)
pharmacist called tricor 48 not covered but 54 mg is OK to change

## 2014-09-10 NOTE — Telephone Encounter (Signed)
Pt aware of labs and that triglycerides 268 will try tricor 48 mg 1 daily re check labs in 8 weeks

## 2014-09-17 ENCOUNTER — Encounter (INDEPENDENT_AMBULATORY_CARE_PROVIDER_SITE_OTHER): Payer: Self-pay | Admitting: *Deleted

## 2014-10-07 ENCOUNTER — Ambulatory Visit: Payer: 59 | Admitting: Adult Health

## 2014-10-15 ENCOUNTER — Encounter: Payer: Self-pay | Admitting: Adult Health

## 2014-10-15 ENCOUNTER — Ambulatory Visit (INDEPENDENT_AMBULATORY_CARE_PROVIDER_SITE_OTHER): Payer: 59 | Admitting: Adult Health

## 2014-10-15 VITALS — BP 126/78 | Ht 67.0 in | Wt 220.5 lb

## 2014-10-15 DIAGNOSIS — E669 Obesity, unspecified: Secondary | ICD-10-CM

## 2014-10-15 MED ORDER — PHENTERMINE HCL 37.5 MG PO CAPS: 37.5000 mg | ORAL_CAPSULE | ORAL | Status: DC

## 2014-10-15 NOTE — Progress Notes (Signed)
Subjective:     Patient ID: Alicia Gross, female   DOB: Jan 28, 1966, 48 y.o.   MRN: [id number]  HPI [name] is a 48 year old white female in for weight and BP check.  Review of Systems See HPI Reviewed past medical,surgical, social and family history. Reviewed medications and allergies.     Objective:   Physical Exam BP 126/78 mmHg  Ht 5\' 7"  (1.702 m)  Wt 220 lb 8 oz (100.018 kg)  BMI 34.53 kg/m2   Skin warm and dry.  Lungs: clear to ausculation bilaterally. Cardiovascular: regular rate and rhythm.She takes a 1/2 tab some days.She has lost 5 lbs and is happy and wants to continue.  Assessment:    Obesity    Plan:     Rx adipex 37.5 mg #30 1 daily no refills Follow in 4 weeks for weight and BP check

## 2014-10-15 NOTE — Patient Instructions (Signed)
Continue weight loss. 

## 2014-10-23 ENCOUNTER — Other Ambulatory Visit: Payer: Self-pay | Admitting: Adult Health

## 2014-11-18 ENCOUNTER — Ambulatory Visit: Payer: 59 | Admitting: Adult Health

## 2014-11-18 ENCOUNTER — Other Ambulatory Visit: Payer: 59

## 2014-12-23 ENCOUNTER — Other Ambulatory Visit: Payer: Self-pay | Admitting: Adult Health

## 2015-01-22 ENCOUNTER — Other Ambulatory Visit: Payer: Self-pay | Admitting: Adult Health

## 2015-01-22 ENCOUNTER — Telehealth: Payer: Self-pay | Admitting: Adult Health

## 2015-01-22 NOTE — Telephone Encounter (Signed)
Kayliah called needs refills on HCTZ and fenofibrate and xanax [place] and and does not have enough meds with her, [facility] [phone] for HCTZ 50 mg #30 1 daily and fenofibrate 54 mg #30 1 daily and gave them the number to [facility] to transfer xanax, and [facility] aware.

## 2015-02-25 ENCOUNTER — Telehealth: Payer: Self-pay | Admitting: Adult Health

## 2015-02-25 MED ORDER — FENOFIBRATE 54 MG PO TABS: 54.0000 mg | ORAL_TABLET | Freq: Every day | ORAL | Status: DC

## 2015-02-25 MED ORDER — HYDROCHLOROTHIAZIDE 50 MG PO TABS: 50.0000 mg | ORAL_TABLET | Freq: Every day | ORAL | Status: DC

## 2015-02-25 MED ORDER — ALPRAZOLAM 1 MG PO TABS: ORAL_TABLET | ORAL | Status: DC

## 2015-02-25 NOTE — Telephone Encounter (Signed)
Refilled xanax,HCTZ and fenofibrate

## 2015-08-28 ENCOUNTER — Other Ambulatory Visit: Payer: Self-pay | Admitting: *Deleted

## 2015-08-28 MED ORDER — ALPRAZOLAM 1 MG PO TABS: ORAL_TABLET | ORAL | Status: DC

## 2015-09-10 ENCOUNTER — Other Ambulatory Visit: Payer: Self-pay | Admitting: Obstetrics and Gynecology

## 2015-09-10 DIAGNOSIS — Z1231 Encounter for screening mammogram for malignant neoplasm of breast: Secondary | ICD-10-CM

## 2015-09-15 ENCOUNTER — Ambulatory Visit (HOSPITAL_COMMUNITY)
Admission: RE | Admit: 2015-09-15 | Discharge: 2015-09-15 | Disposition: A | Discharge status: Home / Self Care | Payer: 59 | Source: Ambulatory Visit | Attending: Obstetrics and Gynecology | Admitting: Obstetrics and Gynecology

## 2015-09-15 ENCOUNTER — Other Ambulatory Visit: Payer: Self-pay | Admitting: Obstetrics and Gynecology

## 2015-09-15 ENCOUNTER — Ambulatory Visit (HOSPITAL_COMMUNITY): Payer: 59

## 2015-09-15 DIAGNOSIS — Z1231 Encounter for screening mammogram for malignant neoplasm of breast: Secondary | ICD-10-CM | POA: Insufficient documentation

## 2015-09-17 ENCOUNTER — Other Ambulatory Visit (INDEPENDENT_AMBULATORY_CARE_PROVIDER_SITE_OTHER): Payer: Self-pay | Admitting: *Deleted

## 2015-09-17 DIAGNOSIS — Z1211 Encounter for screening for malignant neoplasm of colon: Secondary | ICD-10-CM

## 2015-09-22 ENCOUNTER — Telehealth: Payer: Self-pay | Admitting: Obstetrics and Gynecology

## 2015-09-22 NOTE — Telephone Encounter (Signed)
Left message that normal mammogram results reveiwed, and encouraged to activate myChart

## 2015-10-08 ENCOUNTER — Encounter (INDEPENDENT_AMBULATORY_CARE_PROVIDER_SITE_OTHER): Payer: Self-pay | Admitting: *Deleted

## 2015-11-04 ENCOUNTER — Telehealth (INDEPENDENT_AMBULATORY_CARE_PROVIDER_SITE_OTHER): Payer: Self-pay | Admitting: *Deleted

## 2015-11-04 NOTE — Telephone Encounter (Signed)
Referring MD/PCP: howard   Procedure: tcs  Reason/Indication:  screening  Has patient had this procedure before?  Yes, over 10 yrs ago  If so, when, by whom and where?    Is there a family history of colon cancer?  no  Who?  What age when diagnosed?    Is patient diabetic?   no      Does patient have prosthetic heart valve or mechanical valve?  no  Do you have a pacemaker?  no  Has patient ever had endocarditis? no  Has patient had joint replacement within last 12 months?  no  Does patient tend to be constipated or take laxatives? no  Does patient have a history of alcohol/drug use?  no  Is patient on Coumadin, Plavix and/or Aspirin? no  Medications: hctz 50 mg daily , xanax 1 mg nightly, multi vit  Allergies: nkda  Medication Adjustment:   Procedure date & time: 11/26/15 at 1030

## 2015-11-04 NOTE — Telephone Encounter (Signed)
agree

## 2015-11-26 ENCOUNTER — Encounter (HOSPITAL_COMMUNITY): Payer: Self-pay | Admitting: *Deleted

## 2015-11-26 ENCOUNTER — Ambulatory Visit (HOSPITAL_COMMUNITY)
Admission: RE | Admit: 2015-11-26 | Discharge: 2015-11-26 | Disposition: A | Discharge status: Home / Self Care | Payer: 59 | Source: Ambulatory Visit | Attending: Internal Medicine | Admitting: Internal Medicine

## 2015-11-26 ENCOUNTER — Encounter (HOSPITAL_COMMUNITY)
Admission: RE | Disposition: A | Discharge status: Home / Self Care | Payer: Self-pay | Source: Ambulatory Visit | Attending: Internal Medicine

## 2015-11-26 DIAGNOSIS — E7800 Pure hypercholesterolemia, unspecified: Secondary | ICD-10-CM | POA: Insufficient documentation

## 2015-11-26 DIAGNOSIS — Z79899 Other long term (current) drug therapy: Secondary | ICD-10-CM | POA: Insufficient documentation

## 2015-11-26 DIAGNOSIS — K5730 Diverticulosis of large intestine without perforation or abscess without bleeding: Secondary | ICD-10-CM | POA: Insufficient documentation

## 2015-11-26 DIAGNOSIS — Z6835 Body mass index (BMI) 35.0-35.9, adult: Secondary | ICD-10-CM | POA: Diagnosis not present

## 2015-11-26 DIAGNOSIS — Z87891 Personal history of nicotine dependence: Secondary | ICD-10-CM | POA: Diagnosis not present

## 2015-11-26 DIAGNOSIS — K644 Residual hemorrhoidal skin tags: Secondary | ICD-10-CM | POA: Diagnosis not present

## 2015-11-26 DIAGNOSIS — E669 Obesity, unspecified: Secondary | ICD-10-CM | POA: Diagnosis not present

## 2015-11-26 DIAGNOSIS — Z1211 Encounter for screening for malignant neoplasm of colon: Secondary | ICD-10-CM

## 2015-11-26 DIAGNOSIS — F419 Anxiety disorder, unspecified: Secondary | ICD-10-CM | POA: Insufficient documentation

## 2015-11-26 DIAGNOSIS — K648 Other hemorrhoids: Secondary | ICD-10-CM | POA: Diagnosis not present

## 2015-11-26 HISTORY — PX: COLONOSCOPY: SHX5424

## 2015-11-26 SURGERY — COLONOSCOPY
Anesthesia: Moderate Sedation

## 2015-11-26 MED ORDER — MEPERIDINE HCL 50 MG/ML IJ SOLN
INTRAMUSCULAR | Status: DC | PRN
  Administered 2015-11-26 (×4): 25 mg via INTRAVENOUS

## 2015-11-26 MED ORDER — MEPERIDINE HCL 50 MG/ML IJ SOLN
INTRAMUSCULAR | Status: DC
Start: 2015-11-26 — End: 2015-11-26
  Filled 2015-11-26: qty 1

## 2015-11-26 MED ORDER — STERILE WATER FOR IRRIGATION IR SOLN
Status: DC | PRN
  Administered 2015-11-26: 11:00:00

## 2015-11-26 MED ORDER — MIDAZOLAM HCL 5 MG/5ML IJ SOLN
INTRAMUSCULAR | Status: AC
  Filled 2015-11-26: qty 10

## 2015-11-26 MED ORDER — MEPERIDINE HCL 50 MG/ML IJ SOLN
INTRAMUSCULAR | Status: AC
  Filled 2015-11-26: qty 1

## 2015-11-26 MED ORDER — MIDAZOLAM HCL 5 MG/5ML IJ SOLN
INTRAMUSCULAR | Status: AC
  Filled 2015-11-26: qty 5

## 2015-11-26 MED ORDER — SODIUM CHLORIDE 0.9 % IV SOLN
INTRAVENOUS | Status: DC
  Administered 2015-11-26: 10:00:00 via INTRAVENOUS

## 2015-11-26 MED ORDER — MIDAZOLAM HCL 5 MG/5ML IJ SOLN
INTRAMUSCULAR | Status: DC | PRN
  Administered 2015-11-26 (×4): 2 mg via INTRAVENOUS
  Administered 2015-11-26: 1 mg via INTRAVENOUS
  Administered 2015-11-26: 3 mg via INTRAVENOUS

## 2015-11-26 NOTE — Op Note (Addendum)
COLONOSCOPY PROCEDURE REPORT  PATIENT:  Alicia Gross  MR#:  [id number] Birthdate:  30-Jul-1966, 50 y.o., female Endoscopist:  Dr. Malissa Hippo, MD Referred By:  Dr.   Selinda Flavin, MD Procedure Date: 11/26/2015  Procedure:   Colonoscopy  Indications:  Average risk screening colonoscopy.  Informed Consent:  The procedure and risks were reviewed with the patient and informed consent was obtained.  Medications:  Demerol 100 mg IV Versed 12 mg IV  First dose administered at 1033 Last dose administered at 1105 Scope out 110:20 AM  Description of procedure:  After a digital rectal exam was performed, that colonoscope was advanced from the anus through the rectum and colon to the area of the cecum, ileocecal valve and appendiceal orifice. The cecum was deeply intubated. These structures were well-seen and photographed for the record. From the level of the cecum and ileocecal valve, the scope was slowly and cautiously withdrawn. The mucosal surfaces were carefully surveyed utilizing scope tip to flexion to facilitate fold flattening as needed. The scope was pulled down into the rectum where a thorough exam including retroflexion was performed.  Ultra Slim scope was  used to complete the examination. Sigmoid colon was felt to be tortuous and noncompliant resulting in loop formation.  Findings:   Prep satisfactory except descending colon may she had stool coating the mucosa. Normal mucosa of cecum, ascending colon, hepatic flexure, transverse colon , splenic flexure and descending colon. Few small diverticula noted at sigmoid colon. Normal rectal mucosa. Small hemorrhoids below the dentate line.   Therapeutic/Diagnostic Maneuvers Performed:   None  Complications:   none  EBL: None  Cecal Withdrawal Time:   8 minutes  Impression:   Examination performed to cecum.  Mild sigmoid colon diverticulosis small external hemorrhoids.   Recommendations:  Standard instructions given. High  fiber diet. Next screening exam in 10 years.  [name]  11/26/2015 11:26 AM  CC: Dr. Selinda Flavin, MD & Dr. Bonnetta Barry ref. provider found

## 2015-11-26 NOTE — Discharge Instructions (Signed)
Resume usual medications and high fiber diet. °No driving for 24 hours. °Next screening exam in 10 years. ° °Colonoscopy, Care After °These instructions give you information on caring for yourself after your procedure. Your doctor may also give you more specific instructions. Call your doctor if you have any problems or questions after your procedure. °HOME CARE °· Do not drive for 24 hours. °· Do not sign important papers or use machinery for 24 hours. °· You may shower. °· You may go back to your usual activities, but go slower for the first 24 hours. °· Take rest breaks often during the first 24 hours. °· Walk around or use warm packs on your belly (abdomen) if you have belly cramping or gas. °· Drink enough fluids to keep your pee (urine) clear or pale yellow. °· Resume your normal diet. Avoid heavy or fried foods. °· Avoid drinking alcohol for 24 hours or as told by your doctor. °· Only take medicines as told by your doctor. °If a tissue sample (biopsy) was taken during the procedure:  °· Do not take aspirin or blood thinners for 7 days, or as told by your doctor. °· Do not drink alcohol for 7 days, or as told by your doctor. °· Eat soft foods for the first 24 hours. °GET HELP IF: °You still have a small amount of blood in your poop (stool) 2-3 days after the procedure. °GET HELP RIGHT AWAY IF: °· You have more than a small amount of blood in your poop. °· You see clumps of tissue (blood clots) in your poop. °· Your belly is puffy (swollen). °· You feel sick to your stomach (nauseous) or throw up (vomit). °· You have a fever. °· You have belly pain that gets worse and medicine does not help. °MAKE SURE YOU: °· Understand these instructions. °· Will watch your condition. °· Will get help right away if you are not doing well or get worse. °  °This information is not intended to replace advice given to you by your health care provider. Make sure you discuss any questions you have with your health care provider. °  °Document Released: 11/20/2010 Document Revised: 10/23/2013 Document Reviewed: 06/25/2013 °Elsevier Interactive Patient Education ©2016 [employer] Inc. ° ° °High-Fiber Diet °Fiber, also called dietary fiber, is a type of carbohydrate found in fruits, vegetables, whole grains, and beans. A high-fiber diet can have many health benefits. Your health care provider may recommend a high-fiber diet to help: °· Prevent constipation. Fiber can make your bowel movements more regular. °· Lower your cholesterol. °· Relieve hemorrhoids, uncomplicated diverticulosis, or irritable bowel syndrome. °· Prevent overeating as part of a weight-loss plan. °· Prevent heart disease, type 2 diabetes, and certain cancers. °WHAT IS MY PLAN? °The recommended daily intake of fiber includes: °· 38 grams for men under age 50. °· 30 grams for men over age 50. °· 25 grams for women under age 50. °· 21 grams for women over age 50. °You can get the recommended daily intake of dietary fiber by eating a variety of fruits, vegetables, grains, and beans. Your health care provider may also recommend a fiber supplement if it is not possible to get enough fiber through your diet. °WHAT DO I NEED TO KNOW ABOUT A HIGH-FIBER DIET? °· Fiber supplements have not been widely studied for their effectiveness, so it is better to get fiber through food sources. °· Always check the fiber content on the nutrition facts label of any prepackaged food. Look for foods   that contain at least 5 grams of fiber per serving. °· Ask your dietitian if you have questions about specific foods that are related to your condition, especially if those foods are not listed in the following section. °· Increase your daily fiber consumption gradually. Increasing your intake of dietary fiber too quickly may cause bloating, cramping, or gas. °· Drink plenty of water. Water helps you to digest fiber. °WHAT FOODS CAN I EAT? °Grains °Whole-grain breads. Multigrain cereal. Oats and oatmeal. Brown  rice. Barley. Bulgur wheat. Millet. Bran muffins. Popcorn. Rye wafer crackers. °Vegetables °Sweet potatoes. Spinach. Kale. Artichokes. Cabbage. Broccoli. Green peas. Carrots. Squash. °Fruits °Berries. Pears. Apples. Oranges. Avocados. Prunes and raisins. Dried figs. °Meats and Other Protein Sources °Navy, kidney, pinto, and soy beans. Split peas. Lentils. Nuts and seeds. °Dairy °Fiber-fortified yogurt. °Beverages °Fiber-fortified soy milk. Fiber-fortified orange juice. °Other °Fiber bars. °The items listed above may not be a complete list of recommended foods or beverages. Contact your dietitian for more options. °WHAT FOODS ARE NOT RECOMMENDED? °Grains °White bread. Pasta made with refined flour. White rice. °Vegetables °Fried potatoes. Canned vegetables. Well-cooked vegetables.  °Fruits °Fruit juice. Cooked, strained fruit. °Meats and Other Protein Sources °Fatty cuts of meat. Fried poultry or fried fish. °Dairy °Milk. Yogurt. Cream cheese. Sour cream. °Beverages °Soft drinks. °Other °Cakes and pastries. Butter and oils. °The items listed above may not be a complete list of foods and beverages to avoid. Contact your dietitian for more information. °WHAT ARE SOME TIPS FOR INCLUDING HIGH-FIBER FOODS IN MY DIET? °· Eat a wide variety of high-fiber foods. °· Make sure that half of all grains consumed each day are whole grains. °· Replace breads and cereals made from refined flour or white flour with whole-grain breads and cereals. °· Replace white rice with brown rice, bulgur wheat, or millet. °· Start the day with a breakfast that is high in fiber, such as a cereal that contains at least 5 grams of fiber per serving. °· Use beans in place of meat in soups, salads, or pasta. °· Eat high-fiber snacks, such as berries, raw vegetables, nuts, or popcorn. °  °This information is not intended to replace advice given to you by your health care provider. Make sure you discuss any questions you have with your health care  provider. °  °Document Released: 10/18/2005 Document Revised: 11/08/2014 Document Reviewed: 04/02/2014 °Elsevier Interactive Patient Education ©2016 Elsevier Inc. ° °

## 2015-11-26 NOTE — H&P (Signed)
Alicia Gross is an 50 y.o. female.   Chief Complaint:  Patient is here for colonoscopy. HPI:  Patient is 50 year old Caucasian female who is here for screening colonoscopy. She denies abdominal pain change in bowel habits or rectal bleeding. Last exam was 10 years ago.  Family history is negative for CRC but her father has had colonic adenomas. She does not know if he's had more than 10 polyps or not  Past Medical History  Diagnosis Date  . Constipation   . Anxiety   . Elevated cholesterol   . Obesity 09/09/2014    Past Surgical History  Procedure Laterality Date  . Abdominal hysterectomy    . Bunionectomy    . Lumbar disc surgery    . Colonoscopy      Family History  Problem Relation Age of Onset  . Diabetes Mother   . Hypertension Father   . Diabetes Father     prediabetic  . Heart disease Paternal Grandmother   . Other Sister     SVT; ablation   Social History:  reports that she has quit smoking. Her smoking use included Cigarettes. She has a 1.25 pack-year smoking history. She has never used smokeless tobacco. She reports that she does not drink alcohol or use illicit drugs.  Allergies: No Known Allergies  Medications Prior to Admission  Medication Sig Dispense Refill  . ALPRAZolam (XANAX) 1 MG tablet TAKE 1/2 TO 1 TABLET BY MOUTH EVERY 8 HOURS AS NEEDED FOR ANXIETY 60 tablet 1  . hydrochlorothiazide (HYDRODIURIL) 50 MG tablet Take 1 tablet (50 mg total) by mouth daily. 90 tablet 3  . Multiple Vitamin (MULTIVITAMIN) tablet Take 1 tablet by mouth daily.    Marland Kitchen FIBER PO Take 1,200 mg by mouth daily.    . Omega-3 Fatty Acids (FISH OIL PO) Take 2,000 mg by mouth daily.    Marland Kitchen OVER THE COUNTER MEDICATION daily. Stool softner 300 mg    . phentermine (ADIPEX-P) 37.5 MG tablet TAKE ONE TABLET BY MOUTH EVERY MORNING (Patient not taking: Reported on 11/14/2015) 30 tablet 0    No results found for this or any previous visit (from the past 48 hour(s)). No results  found.  ROS  Blood pressure 133/77, pulse 86, temperature 97.9 F (36.6 C), temperature source Oral, resp. rate 17, height  (1.702 m), weight 228 lb (103.42 kg), SpO2 100 %. Physical Exam  Constitutional: She appears well-developed and well-nourished.  HENT:  Mouth/Throat: Oropharynx is clear and moist.  Eyes: Conjunctivae are normal. No scleral icterus.  Neck: No thyromegaly present.  Cardiovascular: Normal rate, regular rhythm and normal heart sounds.   No murmur heard. Respiratory: Effort normal and breath sounds normal.  GI: Soft. She exhibits no distension and no mass. There is no tenderness.  Musculoskeletal: She exhibits no edema.  Lymphadenopathy:    She has no cervical adenopathy.  Neurological: She is alert.  Skin: Skin is warm and dry.     Assessment/Plan  Average risk screening colonoscopy.  REHMAN,NAJEEB U 11/26/2015, 10:29 AM

## 2015-11-28 ENCOUNTER — Encounter (HOSPITAL_COMMUNITY): Payer: Self-pay | Admitting: Internal Medicine

## 2015-12-23 ENCOUNTER — Other Ambulatory Visit: Payer: Self-pay | Admitting: Adult Health

## 2016-02-04 ENCOUNTER — Other Ambulatory Visit: Payer: Self-pay | Admitting: Adult Health

## 2016-03-10 ENCOUNTER — Other Ambulatory Visit: Payer: Self-pay | Admitting: Adult Health

## 2016-05-19 ENCOUNTER — Other Ambulatory Visit: Payer: Self-pay | Admitting: Adult Health

## 2016-07-23 ENCOUNTER — Other Ambulatory Visit: Payer: Self-pay | Admitting: *Deleted

## 2016-07-26 MED ORDER — ALPRAZOLAM 1 MG PO TABS: ORAL_TABLET | ORAL | 0 refills | Status: DC

## 2016-08-16 ENCOUNTER — Other Ambulatory Visit: Payer: Self-pay | Admitting: Obstetrics and Gynecology

## 2016-08-16 DIAGNOSIS — Z1231 Encounter for screening mammogram for malignant neoplasm of breast: Secondary | ICD-10-CM

## 2016-09-22 ENCOUNTER — Ambulatory Visit (HOSPITAL_COMMUNITY)
Admission: RE | Admit: 2016-09-22 | Discharge: 2016-09-22 | Disposition: A | Discharge status: Home / Self Care | Payer: 59 | Source: Ambulatory Visit | Attending: Obstetrics and Gynecology | Admitting: Obstetrics and Gynecology

## 2016-09-22 DIAGNOSIS — Z1231 Encounter for screening mammogram for malignant neoplasm of breast: Secondary | ICD-10-CM | POA: Insufficient documentation

## 2016-10-01 HISTORY — PX: SKIN BIOPSY: SHX1

## 2016-11-10 ENCOUNTER — Other Ambulatory Visit: Payer: Self-pay | Admitting: Adult Health

## 2016-12-10 ENCOUNTER — Encounter: Payer: Self-pay | Admitting: Adult Health

## 2016-12-10 ENCOUNTER — Ambulatory Visit (INDEPENDENT_AMBULATORY_CARE_PROVIDER_SITE_OTHER): Payer: 59 | Admitting: Adult Health

## 2016-12-10 VITALS — BP 110/70 | HR 84 | Ht 66.0 in | Wt 224.5 lb

## 2016-12-10 DIAGNOSIS — Z131 Encounter for screening for diabetes mellitus: Secondary | ICD-10-CM

## 2016-12-10 DIAGNOSIS — Z1211 Encounter for screening for malignant neoplasm of colon: Secondary | ICD-10-CM

## 2016-12-10 DIAGNOSIS — Z01419 Encounter for gynecological examination (general) (routine) without abnormal findings: Secondary | ICD-10-CM | POA: Diagnosis not present

## 2016-12-10 DIAGNOSIS — Z1212 Encounter for screening for malignant neoplasm of rectum: Secondary | ICD-10-CM

## 2016-12-10 LAB — HEMOCCULT GUIAC POC 1CARD (OFFICE): Fecal Occult Blood, POC: NEGATIVE

## 2016-12-10 MED ORDER — ALPRAZOLAM 1 MG PO TABS: ORAL_TABLET | ORAL | 1 refills | Status: DC

## 2016-12-10 NOTE — Patient Instructions (Signed)
Physical in 1 year Mammogram yearly  

## 2016-12-10 NOTE — Progress Notes (Signed)
Patient ID: Alicia Gross, female   DOB: [birth year], 51 y.o.   MRN: [id number] History of Present Illness: Alicia Gross is a 51 year old white female, married, sp hysterectomy in for a well woman gyn exam. She is working from home for [employer] and caring for aging parents.She has feelings of guilt at times. PCP is [facility].    Current Medications, Allergies, Past Medical History, Past Surgical History, Family History and Social History were reviewed in [facility] electronic medical record.     Review of Systems:  Patient denies any headaches, hearing loss, fatigue, blurred vision, shortness of breath, chest pain, abdominal pain, problems with bowel movements, urination, or intercourse. No joint pain or mood swings.Feels guilty at times over parents.    Physical Exam:BP 110/70 (BP Location: Left Arm, Patient Position: Sitting, Cuff Size: Large)   Pulse 84   Ht 5\' 6"  (1.676 m)   Wt 224 lb 8 oz (101.8 kg)   BMI 36.24 kg/m  General:  Well developed, well nourished, no acute distress Skin:  Warm and dry Neck:  Midline trachea, normal thyroid, good ROM, no lymphadenopathy Lungs; Clear to auscultation bilaterally Breast:  No dominant palpable mass, retraction, or nipple discharge Cardiovascular: Regular rate and rhythm Abdomen:  Soft, non tender, no hepatosplenomegaly Pelvic:  External genitalia is normal in appearance, no lesions.  The vagina is normal in appearance. Urethra has no lesions or masses. The cervix and uterus are absent.  No adnexal masses or tenderness noted.Bladder is non tender, no masses felt. Rectal: Good sphincter tone, no polyps, + hemorrhoids felt.  Hemoccult negative. Extremities/musculoskeletal:  No swelling or varicosities noted, no clubbing or cyanosis Psych:  No mood changes, alert and cooperative,seems happy PHQ 2 score 0.  Impression: 1. Well woman exam with routine gynecological exam   2. Screening for colorectal cancer   3. Screening for diabetes mellitus        Plan: Check CBC,CMP,TSH and lipids,A1c and vitamin D Physical in 1 year Mammogram yearly Meds ordered this encounter  Medications  . ALPRAZolam (XANAX) 1 MG tablet    Sig: TAKE 1/2 TO 1 TABLET BY MOUTH EVERY 8 HOURS AS NEEDED FOR ANXIETY    Dispense:  60 tablet    Refill:  1    Order Specific Question:   Supervising Provider    Answer:   Alicia Gross, LUTHER H [[id number]]

## 2016-12-11 LAB — COMPREHENSIVE METABOLIC PANEL
A/G RATIO: 2 (ref 1.2–2.2)
ALT: 19 IU/L (ref 0–32)
AST: 18 IU/L (ref 0–40)
Albumin: 4.8 g/dL (ref 3.5–5.5)
Alkaline Phosphatase: 105 IU/L (ref 39–117)
BILIRUBIN TOTAL: 0.5 mg/dL (ref 0.0–1.2)
BUN/Creatinine Ratio: 15 (ref 9–23)
BUN: 12 mg/dL (ref 6–24)
CALCIUM: 9.7 mg/dL (ref 8.7–10.2)
CHLORIDE: 99 mmol/L (ref 96–106)
CO2: 24 mmol/L (ref 18–29)
Creatinine, Ser: 0.82 mg/dL (ref 0.57–1.00)
GFR calc Af Amer: 95 mL/min/{1.73_m2} (ref >59)
GFR, EST NON AFRICAN AMERICAN: 83 mL/min/{1.73_m2} (ref >59)
GLOBULIN, TOTAL: 2.4 g/dL (ref 1.5–4.5)
Glucose: 110 mg/dL — ABNORMAL HIGH (ref 65–99)
POTASSIUM: 4.4 mmol/L (ref 3.5–5.2)
SODIUM: 138 mmol/L (ref 134–144)
Total Protein: 7.2 g/dL (ref 6.0–8.5)

## 2016-12-11 LAB — LIPID PANEL
CHOL/HDL RATIO: 5.2 ratio — AB (ref 0.0–4.4)
Cholesterol, Total: 181 mg/dL (ref 100–199)
HDL: 35 mg/dL — AB (ref >39)
LDL Calculated: 93 mg/dL (ref 0–99)
TRIGLYCERIDES: 267 mg/dL — AB (ref 0–149)
VLDL CHOLESTEROL CAL: 53 mg/dL — AB (ref 5–40)

## 2016-12-11 LAB — CBC
HEMATOCRIT: 42.3 % (ref 34.0–46.6)
Hemoglobin: 14.7 g/dL (ref 11.1–15.9)
MCH: 31.3 pg (ref 26.6–33.0)
MCHC: 34.8 g/dL (ref 31.5–35.7)
MCV: 90 fL (ref 79–97)
Platelets: 191 10*3/uL (ref 150–379)
RBC: 4.7 x10E6/uL (ref 3.77–5.28)
RDW: 14.2 % (ref 12.3–15.4)
WBC: 8.1 10*3/uL (ref 3.4–10.8)

## 2016-12-11 LAB — HEMOGLOBIN A1C
Est. average glucose Bld gHb Est-mCnc: 128 mg/dL
HEMOGLOBIN A1C: 6.1 % — AB (ref 4.8–5.6)

## 2016-12-11 LAB — TSH: TSH: 2.38 u[IU]/mL (ref 0.450–4.500)

## 2016-12-11 LAB — VITAMIN D 25 HYDROXY (VIT D DEFICIENCY, FRACTURES): Vit D, 25-Hydroxy: 23.6 ng/mL — ABNORMAL LOW (ref 30.0–100.0)

## 2016-12-13 ENCOUNTER — Telehealth: Payer: Self-pay | Admitting: Adult Health

## 2016-12-13 ENCOUNTER — Encounter: Payer: Self-pay | Admitting: Adult Health

## 2016-12-13 DIAGNOSIS — E782 Mixed hyperlipidemia: Secondary | ICD-10-CM | POA: Insufficient documentation

## 2016-12-13 DIAGNOSIS — E559 Vitamin D deficiency, unspecified: Secondary | ICD-10-CM | POA: Insufficient documentation

## 2016-12-13 DIAGNOSIS — R7309 Other abnormal glucose: Secondary | ICD-10-CM

## 2016-12-13 HISTORY — DX: Mixed hyperlipidemia: E78.2

## 2016-12-13 HISTORY — DX: Vitamin D deficiency, unspecified: E55.9

## 2016-12-13 HISTORY — DX: Other abnormal glucose: R73.09

## 2016-12-13 NOTE — Telephone Encounter (Signed)
Left message that A1c elevated to 6.1, cut carbs, take vitamin D 3 5000 IU daily and need meds for trig., call me

## 2017-01-25 ENCOUNTER — Other Ambulatory Visit: Payer: Self-pay | Admitting: Obstetrics & Gynecology

## 2017-01-25 ENCOUNTER — Telehealth: Payer: Self-pay | Admitting: Adult Health

## 2017-01-25 MED ORDER — DOXYCYCLINE HYCLATE 100 MG PO CAPS: 100.0000 mg | ORAL_CAPSULE | Freq: Two times a day (BID) | ORAL | 1 refills | Status: DC

## 2017-01-25 NOTE — Telephone Encounter (Signed)
Has rosacea and has boil like area, will rx doxycycline til can see dermatologist

## 2017-03-24 ENCOUNTER — Other Ambulatory Visit: Payer: Self-pay | Admitting: Adult Health

## 2017-06-22 ENCOUNTER — Other Ambulatory Visit: Payer: Self-pay | Admitting: Obstetrics & Gynecology

## 2017-06-23 ENCOUNTER — Other Ambulatory Visit: Payer: Self-pay | Admitting: *Deleted

## 2017-06-24 MED ORDER — ALPRAZOLAM 1 MG PO TABS
ORAL_TABLET | ORAL | 1 refills | Status: DC
Start: 2017-06-24 — End: 2017-10-31

## 2017-06-24 NOTE — Telephone Encounter (Signed)
Pt called requesting refill on xanax. Informed pt that I would send the request to provider and she should check with her pharmacy later today.

## 2017-10-31 ENCOUNTER — Other Ambulatory Visit: Payer: Self-pay | Admitting: Adult Health

## 2017-11-04 ENCOUNTER — Emergency Department (HOSPITAL_COMMUNITY)
Admission: EM | Admit: 2017-11-04 | Discharge: 2017-11-04 | Disposition: A | Discharge status: Home / Self Care | Payer: 59 | Attending: Emergency Medicine | Admitting: Emergency Medicine

## 2017-11-04 ENCOUNTER — Encounter (HOSPITAL_COMMUNITY): Payer: Self-pay

## 2017-11-04 ENCOUNTER — Emergency Department (HOSPITAL_COMMUNITY): Payer: 59

## 2017-11-04 ENCOUNTER — Ambulatory Visit (INDEPENDENT_AMBULATORY_CARE_PROVIDER_SITE_OTHER): Payer: 59 | Admitting: Cardiovascular Disease

## 2017-11-04 ENCOUNTER — Encounter: Payer: Self-pay | Admitting: Cardiovascular Disease

## 2017-11-04 DIAGNOSIS — Z79899 Other long term (current) drug therapy: Secondary | ICD-10-CM | POA: Diagnosis not present

## 2017-11-04 DIAGNOSIS — R05 Cough: Secondary | ICD-10-CM | POA: Insufficient documentation

## 2017-11-04 DIAGNOSIS — Z87891 Personal history of nicotine dependence: Secondary | ICD-10-CM | POA: Insufficient documentation

## 2017-11-04 DIAGNOSIS — R110 Nausea: Secondary | ICD-10-CM | POA: Insufficient documentation

## 2017-11-04 DIAGNOSIS — R072 Precordial pain: Secondary | ICD-10-CM | POA: Diagnosis not present

## 2017-11-04 DIAGNOSIS — R5383 Other fatigue: Secondary | ICD-10-CM | POA: Insufficient documentation

## 2017-11-04 DIAGNOSIS — R079 Chest pain, unspecified: Secondary | ICD-10-CM | POA: Diagnosis present

## 2017-11-04 DIAGNOSIS — R0602 Shortness of breath: Secondary | ICD-10-CM | POA: Diagnosis not present

## 2017-11-04 DIAGNOSIS — M549 Dorsalgia, unspecified: Secondary | ICD-10-CM | POA: Insufficient documentation

## 2017-11-04 DIAGNOSIS — R5381 Other malaise: Secondary | ICD-10-CM | POA: Diagnosis not present

## 2017-11-04 DIAGNOSIS — R0789 Other chest pain: Secondary | ICD-10-CM | POA: Diagnosis not present

## 2017-11-04 DIAGNOSIS — E7800 Pure hypercholesterolemia, unspecified: Secondary | ICD-10-CM | POA: Diagnosis not present

## 2017-11-04 DIAGNOSIS — R109 Unspecified abdominal pain: Secondary | ICD-10-CM | POA: Diagnosis not present

## 2017-11-04 LAB — COMPREHENSIVE METABOLIC PANEL
ALT: 19 U/L (ref 14–54)
AST: 25 U/L (ref 15–41)
Albumin: 4.5 g/dL (ref 3.5–5.0)
Alkaline Phosphatase: 92 U/L (ref 38–126)
Anion gap: 12 (ref 5–15)
BUN: 10 mg/dL (ref 6–20)
CHLORIDE: 98 mmol/L — AB (ref 101–111)
CO2: 28 mmol/L (ref 22–32)
CREATININE: 0.76 mg/dL (ref 0.44–1.00)
Calcium: 9.4 mg/dL (ref 8.9–10.3)
GFR calc Af Amer: >60 mL/min (ref >60)
GFR calc non Af Amer: >60 mL/min (ref >60)
Glucose, Bld: 142 mg/dL — ABNORMAL HIGH (ref 65–99)
Potassium: 3 mmol/L — ABNORMAL LOW (ref 3.5–5.1)
SODIUM: 138 mmol/L (ref 135–145)
Total Bilirubin: 0.8 mg/dL (ref 0.3–1.2)
Total Protein: 7.8 g/dL (ref 6.5–8.1)

## 2017-11-04 LAB — I-STAT TROPONIN, ED
Troponin i, poc: 0 ng/mL (ref 0.00–0.08)
Troponin i, poc: 0 ng/mL (ref 0.00–0.08)

## 2017-11-04 LAB — CBC
HEMATOCRIT: 42.7 % (ref 36.0–46.0)
HEMOGLOBIN: 14.9 g/dL (ref 12.0–15.0)
MCH: 30.2 pg (ref 26.0–34.0)
MCHC: 34.9 g/dL (ref 30.0–36.0)
MCV: 86.4 fL (ref 78.0–100.0)
Platelets: 234 10*3/uL (ref 150–400)
RBC: 4.94 MIL/uL (ref 3.87–5.11)
RDW: 12.4 % (ref 11.5–15.5)
WBC: 9.5 10*3/uL (ref 4.0–10.5)

## 2017-11-04 LAB — LIPASE, BLOOD: Lipase: 31 U/L (ref 11–51)

## 2017-11-04 LAB — D-DIMER, QUANTITATIVE (NOT AT ARMC)

## 2017-11-04 MED ORDER — HYDROXYZINE HCL 25 MG PO TABS: 25.0000 mg | ORAL_TABLET | Freq: Three times a day (TID) | ORAL | 0 refills | Status: AC | PRN

## 2017-11-04 MED ORDER — SUCRALFATE 1 GM/10ML PO SUSP
1.0000 g | Freq: Three times a day (TID) | ORAL | Status: DC
  Administered 2017-11-04: 1 g via ORAL
  Filled 2017-11-04 (×3): qty 10

## 2017-11-04 MED ORDER — HYDROXYZINE HCL 25 MG PO TABS
25.0000 mg | ORAL_TABLET | Freq: Once | ORAL | Status: AC
  Administered 2017-11-04: 25 mg via ORAL
  Filled 2017-11-04: qty 1

## 2017-11-04 NOTE — ED Provider Notes (Signed)
Patient seen and evaluated. Discussed with Dr. Griffith CitronPettit. Patient with neck chest and upper abdominal pain since last night. Low risk for PE, ACS. EKG normal first troponin normal second troponin pending. Low risk for PE. Pending D-dimer.   Normal chest x-ray. Has been under "a lot of stress". Passing today. Symptoms worse with laying down last night. So comfortable with second enzyme evaluation and rule out ACS if negative. If negative d-dimer would be a little for discharge and outpatient follow-up. We'll try dose of Carafate here. She took Prilosec this morning. We'll reevaluate after completion of delta troponin, and d-dimer.   Rolland PorterJames, Gevin Perea, MD 11/04/17 [phone]

## 2017-11-04 NOTE — ED Provider Notes (Signed)
[facility]ENC[facility] Provider Note   CSN: [id number] Arrival date & time: 11/04/17  1444     History   Chief Complaint Chief Complaint  Patient presents with  . Chest Pain    HPI Alicia Gross is a 52 y.o. female.  The history is provided by the patient.  Chest Pain   This is a new problem. The current episode started 2 days ago. Episode frequency: intermittently. The problem has been gradually improving. The pain is associated with rest. The pain is present in the substernal region. The pain is moderate. The quality of the pain is described as sharp. The pain radiates to the right shoulder and left neck. Associated symptoms include abdominal pain, back pain, cough, malaise/fatigue, nausea and shortness of breath. Pertinent negatives include no fever, no leg pain, no lower extremity edema, no numbness, no palpitations, no sputum production and no vomiting. Treatments tried: ASA. The treatment provided no relief. Risk factors include obesity.  Her past medical history is significant for hyperlipidemia.    Past Medical History:  Diagnosis Date  . Anxiety   . Constipation   . Elevated cholesterol   . Elevated cholesterol with high triglycerides 12/13/2016  . Elevated hemoglobin A1c 12/13/2016  . Obesity 09/09/2014  . Rosacea   . Shingles   . Vitamin D deficiency 12/13/2016    Patient Active Problem List   Diagnosis Date Noted  . Atypical chest pain 11/04/2017  . Elevated cholesterol with high triglycerides 12/13/2016  . Vitamin D deficiency 12/13/2016  . Elevated hemoglobin A1c 12/13/2016  . Obesity 09/09/2014  . Elevated cholesterol 10/30/2013  . Anxiety 10/30/2013    Past Surgical History:  Procedure Laterality Date  . ABDOMINAL HYSTERECTOMY    . BUNIONECTOMY    . COLONOSCOPY    . COLONOSCOPY N/A 11/26/2015   Procedure: COLONOSCOPY;  Surgeon: Malissa HippoNajeeb U Rehman, MD;  Location: [facility];  Service: Endoscopy;  Laterality: N/A;  1030  .  LUMBAR DISC SURGERY    . SKIN BIOPSY  10/2016    OB History    Gravida Para Term Preterm AB Living   2 2       2    SAB TAB Ectopic Multiple Live Births           2       Home Medications    Prior to Admission medications   Medication Sig Start Date End Date Taking? Authorizing Provider  ALPRAZolam (XANAX) 1 MG tablet TAKE 1/2 TO 1 TABLET BY MOUTH EVERY 8 HOURS AS NEEDED FOR ANXIETY Patient taking differently: TAKE 1MG  BY MOUTH DAILY AT BEDTIME 10/31/17  Yes Cyril MourningGriffin, Jennifer A, NP  diphenhydramine-acetaminophen (TYLENOL PM) 25-500 MG TABS tablet Take 1 tablet by mouth at bedtime as needed (sleep).   Yes [provider]  hydrochlorothiazide (HYDRODIURIL) 50 MG tablet TAKE ONE TABLET BY MOUTH DAILY Patient taking differently: TAKE ONE TABLET (50MG )BY MOUTH DAILY 03/24/17  Yes Cyril MourningGriffin, Jennifer A, NP  Multiple Vitamin (MULTIVITAMIN) tablet Take 1 tablet by mouth daily.   Yes [provider]  omeprazole (PRILOSEC) 40 MG capsule Take 40 mg by mouth daily as needed (indigestion).   Yes [provider]  hydrOXYzine (ATARAX/VISTARIL) 25 MG tablet Take 1 tablet (25 mg total) by mouth every 8 (eight) hours as needed for up to 7 days for anxiety. 11/04/17 11/11/17  Forest BeckerPetit, Aslee Such, MD    Family History Family History  Problem Relation Age of Onset  . Diabetes Mother   .  Hypertension Father   . Diabetes Father        prediabetic  . Heart disease Paternal Grandmother   . Other Sister        SVT; ablation    Social History Social History   Tobacco Use  . Smoking status: Former Smoker    Packs/day: 0.25    Years: 5.00    Pack years: 1.25    Types: Cigarettes  . Smokeless tobacco: Never Used  Substance Use Topics  . Alcohol use: No  . Drug use: No     Allergies   Patient has no known allergies.   Review of Systems Review of Systems  Constitutional: Positive for malaise/fatigue. Negative for chills and fever.  HENT: Negative for rhinorrhea and sore  throat.   Eyes: Negative for visual disturbance.  Respiratory: Positive for cough and shortness of breath. Negative for sputum production.   Cardiovascular: Positive for chest pain. Negative for palpitations.  Gastrointestinal: Positive for abdominal pain and nausea. Negative for vomiting.  Genitourinary: Negative for dysuria.  Musculoskeletal: Positive for back pain. Negative for arthralgias.  Skin: Negative for rash.  Allergic/Immunologic: Negative for immunocompromised state.  Neurological: Negative for syncope and numbness.  Psychiatric/Behavioral: Negative for confusion.  All other systems reviewed and are negative.    Physical Exam Updated Vital Signs BP (!) 127/97   Pulse 90   Temp 98.7 F (37.1 C) (Oral)   Resp 17   Ht 5\' 7"  (1.702 m)   Wt 101.6 kg (224 lb)   SpO2 95%   BMI 35.08 kg/m   Physical Exam  Constitutional: She is oriented to person, place, and time. She appears well-developed and well-nourished. No distress.  HENT:  Head: Normocephalic and atraumatic.  Eyes: Conjunctivae are normal.  Neck: Neck supple.  Cardiovascular: Normal rate and regular rhythm.  No murmur heard. Pulmonary/Chest: Effort normal and breath sounds normal. No respiratory distress.  Abdominal: Soft. There is tenderness.  Mild RUQ, no Murphy's  Musculoskeletal: Normal range of motion. She exhibits no edema.  Neurological: She is alert and oriented to person, place, and time.  Skin: Skin is warm and dry.  Psychiatric: She has a normal mood and affect.  Nursing note and vitals reviewed.    ED Treatments / Results  Labs (all labs ordered are listed, but only abnormal results are displayed) Labs Reviewed  COMPREHENSIVE METABOLIC PANEL - Abnormal; Notable for the following components:      Result Value   Potassium 3.0 (*)    Chloride 98 (*)    Glucose, Bld 142 (*)    All other components within normal limits  CBC  LIPASE, BLOOD  D-DIMER, QUANTITATIVE (NOT AT [facility])  I-STAT  TROPONIN, ED  I-STAT TROPONIN, ED    EKG  EKG Interpretation None       Radiology Dg Chest 2 View  Result Date: 11/04/2017 CLINICAL DATA:  Chest pain EXAM: CHEST  2 VIEW COMPARISON:  06/06/2017 FINDINGS: The heart size and mediastinal contours are within normal limits. Both lungs are clear. The visualized skeletal structures are unremarkable. IMPRESSION: No active cardiopulmonary disease. Electronically Signed   By: Marlan Palau M.D.   On: 11/04/2017 15:45    Procedures Procedures (including critical care time)  Medications Ordered in ED Medications  sucralfate (CARAFATE) 1 GM/10ML suspension 1 g (1 g Oral Given 11/04/17 1820)  hydrOXYzine (ATARAX/VISTARIL) tablet 25 mg (25 mg Oral Given 11/04/17 1648)     Initial Impression / Assessment and Plan / ED Course  I  have reviewed the triage vital signs and the nursing notes.  Pertinent labs & imaging results that were available during my care of the patient were reviewed by me and considered in my medical decision making (see chart for details).     Pt with h/o HLD (not on statins) presents with CP. Says she had some substernal CP yesterday w/radiation to the right shoulder; she took Excedrin and went to bed. This morning, she also had some radiation to the left jaw, SOB, and pleuritic pain, but now says the pain is localized to her upper back (b/w her scapulae) w/o radiation or associated symptoms. She went to a cardiologist earlier today, had a normal EKG, and was referred here for atypical CP; the cardiologist recommended serial enzymes, d-dimer, and possible CTA.  VS & exam as above. Atarax given for anxiety. EKG: ST @ 103bpm, w/normal intervals & no signs of ischemia. Low risk for PE per Wells'. HEAR score 2. CXR WNL. Labs unremarkable, including two undetectable troponins & a negative d-dimer. Cause of the Pt's symptoms unclear, but doubt emergent etiology given history, exam, and workup.  Explained all results to the Pt &  husband. Will discharge the Pt home with rx for Atarax. Recommending follow-up with PCP. ED return precautions provided. Pt acknowledged understanding of, and concurrence with the plan. All questions answered to her satisfaction. In stable condition at the time of discharge.  Final Clinical Impressions(s) / ED Diagnoses   Final diagnoses:  Precordial pain    ED Discharge Orders        Ordered    hydrOXYzine (ATARAX/VISTARIL) 25 MG tablet  Every 8 hours PRN     11/04/17 1842       Forest Becker, MD 11/04/17 1846    Rolland Porter, MD 11/05/17 2227

## 2017-11-04 NOTE — ED Notes (Signed)
Pt discharged from ED; instructions provided and scripts given; Pt encouraged to return to ED if symptoms worsen and to f/u with PCP; Pt verbalized understanding of all instructions 

## 2017-11-04 NOTE — Assessment & Plan Note (Signed)
Alicia Gross was referred for atypical chest pain. She has no risk factors other than untreated hyperlipidemia. 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 She had chest pain last night that rates her back again this morning rating to her right shoulder and left jaw with some shortness of breath and air hunger. There is some pleuritic component to this. Exam is benign. EKG shows no acute changes. I think the most prudent thing is to admit her to the hospital for rule out MI with serial enzymes. She will also need a d-dimer and potentially a CT angiogram to rule out pulmonary embolus.

## 2017-11-04 NOTE — ED Notes (Signed)
Signature pad not working. 

## 2017-11-04 NOTE — ED Triage Notes (Signed)
Patient presents from [facility] via [facility]. She was seeing Dr.Berry for complaints of chest pain that started last night. She states that the pain was left sided that started to the right shoulder and then into her back. Pt took asa last night and went to bed. Her husband in a paramedic and put her on the monitor and she states that the reading stated inferior mi. Patient woke up with mid back pain today and it progressed to left sided neck pain. She states that it feels like she needs to take a deep breath but cannot. Alert and oriented. Iv started pta by ems. Pt received 324mg  asa.

## 2017-11-04 NOTE — Assessment & Plan Note (Signed)
History of hyperlipidemia intolerant to statin therapy 

## 2017-11-04 NOTE — Progress Notes (Signed)
11/04/2017 Sharyon S Tisdell   August 03, 1966  [id number]  Primary Physician Selinda Flavin, MD Primary Cardiologist: Runell Gess MD Nicholes Calamity, [place]  HPI:  Rosana Farnell Marini is a 52 y.o. moderately overweight married Caucasian female married to a paramedic, mother of 2, grandmother and 2 grandchildren who is self-referred for atypical chest pain. 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 She really has no risk factors other than untreated hyperlipidemia because of statin intolerance. There is no family history. She developed some atypical chest pain last night. She does admit to some family stressors. This morning she had some pleuritic chest pain rating to her back and left jaw with some shortness of breath. She has ongoing symptoms currently. EKG shows no acute changes. I am going to transferred to [facility] emergency room via EMS for further evaluation and workup.   Current Meds  Medication Sig  . ALPRAZolam (XANAX) 1 MG tablet TAKE 1/2 TO 1 TABLET BY MOUTH EVERY 8 HOURS AS NEEDED FOR ANXIETY  . hydrochlorothiazide (HYDRODIURIL) 50 MG tablet TAKE ONE TABLET BY MOUTH DAILY  . Multiple Vitamin (MULTIVITAMIN) tablet Take 1 tablet by mouth daily.     No Known Allergies  Social History   Socioeconomic History  . Marital status: Married    Spouse name: Not on file  . Number of children: Not on file  . Years of education: Not on file  . Highest education level: Not on file  Social Needs  . Financial resource strain: Not on file  . Food insecurity - worry: Not on file  . Food insecurity - inability: Not on file  . Transportation needs - medical: Not on file  . Transportation needs - non-medical: Not on file  Occupational History  . Not on file  Tobacco Use  . Smoking status: Former Smoker    Packs/day: 0.25    Years: 5.00    Pack years: 1.25    Types: Cigarettes  . Smokeless tobacco: Never Used  Substance and Sexual Activity  . Alcohol use: No  . Drug  use: No  . Sexual activity: Yes    Birth control/protection: Surgical    Comment: hyst  Other Topics Concern  . Not on file  Social History Narrative  . Not on file     Review of Systems: General: negative for chills, fever, night sweats or weight changes.  Cardiovascular: negative for chest pain, dyspnea on exertion, edema, orthopnea, palpitations, paroxysmal nocturnal dyspnea or shortness of breath Dermatological: negative for rash Respiratory: negative for cough or wheezing Urologic: negative for hematuria Abdominal: negative for nausea, vomiting, diarrhea, bright red blood per rectum, melena, or hematemesis Neurologic: negative for visual changes, syncope, or dizziness All other systems reviewed and are otherwise negative except as noted above.    Blood pressure 114/88, pulse (!) 104, height 5' 6.5" (1.689 m), weight 224 lb 6.4 oz (101.8 kg).  General appearance: alert and no distress Neck: no adenopathy, no carotid bruit, no JVD, supple, symmetrical, trachea midline and thyroid not enlarged, symmetric, no tenderness/mass/nodules Lungs: clear to auscultation bilaterally Heart: regular rate and rhythm, S1, S2 normal, no murmur, click, rub or gallop Extremities: extremities normal, atraumatic, no cyanosis or edema Pulses: 2+ and symmetric Skin: Skin color, texture, turgor normal. No rashes or lesions Neurologic: Alert and oriented X 3, normal strength and tone. Normal symmetric reflexes. Normal coordination and gait  EKG sinus tachycardia at 104 with left axis deviation and potentially inferior Q  waves. I personally reviewed this EKG.  ASSESSMENT AND PLAN:   Elevated cholesterol History of hyperlipidemia intolerant to statin therapy  Atypical chest pain Ms Ybarbo was referred for atypical chest pain. She has no risk factors other than untreated hyperlipidemia. 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 She had chest pain last night that rates her back again this  morning rating to her right shoulder and left jaw with some shortness of breath and air hunger. There is some pleuritic component to this. Exam is benign. EKG shows no acute changes. I think the most prudent thing is to admit her to the hospital for rule out MI with serial enzymes. She will also need a d-dimer and potentially a CT angiogram to rule out pulmonary embolus.      Runell GessJonathan J. Emilene Roma MD FACP,FACC,FAHA, [facility] 11/04/2017 1:53 PM

## 2017-11-09 ENCOUNTER — Other Ambulatory Visit: Payer: Self-pay | Admitting: Obstetrics and Gynecology

## 2017-11-09 DIAGNOSIS — Z1231 Encounter for screening mammogram for malignant neoplasm of breast: Secondary | ICD-10-CM

## 2017-12-09 ENCOUNTER — Ambulatory Visit (HOSPITAL_COMMUNITY)
Admission: RE | Admit: 2017-12-09 | Discharge: 2017-12-09 | Disposition: A | Discharge status: Home / Self Care | Payer: 59 | Source: Ambulatory Visit | Attending: Obstetrics and Gynecology | Admitting: Obstetrics and Gynecology

## 2017-12-09 ENCOUNTER — Ambulatory Visit (INDEPENDENT_AMBULATORY_CARE_PROVIDER_SITE_OTHER): Payer: 59 | Admitting: Adult Health

## 2017-12-09 ENCOUNTER — Encounter: Payer: Self-pay | Admitting: Adult Health

## 2017-12-09 VITALS — BP 128/90 | HR 89 | Ht 66.25 in | Wt 220.5 lb

## 2017-12-09 DIAGNOSIS — F419 Anxiety disorder, unspecified: Secondary | ICD-10-CM | POA: Diagnosis not present

## 2017-12-09 DIAGNOSIS — Z1231 Encounter for screening mammogram for malignant neoplasm of breast: Secondary | ICD-10-CM | POA: Diagnosis present

## 2017-12-09 DIAGNOSIS — E782 Mixed hyperlipidemia: Secondary | ICD-10-CM

## 2017-12-09 DIAGNOSIS — Z1212 Encounter for screening for malignant neoplasm of rectum: Secondary | ICD-10-CM | POA: Diagnosis not present

## 2017-12-09 DIAGNOSIS — Z1211 Encounter for screening for malignant neoplasm of colon: Secondary | ICD-10-CM | POA: Diagnosis not present

## 2017-12-09 DIAGNOSIS — F329 Major depressive disorder, single episode, unspecified: Secondary | ICD-10-CM

## 2017-12-09 DIAGNOSIS — R7309 Other abnormal glucose: Secondary | ICD-10-CM

## 2017-12-09 DIAGNOSIS — E559 Vitamin D deficiency, unspecified: Secondary | ICD-10-CM

## 2017-12-09 DIAGNOSIS — Z01411 Encounter for gynecological examination (general) (routine) with abnormal findings: Secondary | ICD-10-CM | POA: Diagnosis not present

## 2017-12-09 DIAGNOSIS — Z01419 Encounter for gynecological examination (general) (routine) without abnormal findings: Secondary | ICD-10-CM

## 2017-12-09 DIAGNOSIS — R030 Elevated blood-pressure reading, without diagnosis of hypertension: Secondary | ICD-10-CM

## 2017-12-09 DIAGNOSIS — F32A Depression, unspecified: Secondary | ICD-10-CM

## 2017-12-09 LAB — HEMOCCULT GUIAC POC 1CARD (OFFICE): FECAL OCCULT BLD: NEGATIVE

## 2017-12-09 MED ORDER — LISINOPRIL 10 MG PO TABS: 10.0000 mg | ORAL_TABLET | Freq: Every day | ORAL | 3 refills | Status: DC

## 2017-12-09 MED ORDER — ALPRAZOLAM 1 MG PO TABS: ORAL_TABLET | ORAL | 1 refills | Status: DC

## 2017-12-09 MED ORDER — ESCITALOPRAM OXALATE 10 MG PO TABS: 10.0000 mg | ORAL_TABLET | Freq: Every day | ORAL | 6 refills | Status: DC

## 2017-12-09 NOTE — Progress Notes (Signed)
Patient ID: Alicia Gross, female   DOB: 07-Jan-1966, 52 y.o.   MRN: [id number] History of Present Illness: Alicia Gross is a 52 year old white female, married,G2P2, sp hysterectomy in for well woman gyn exam.She had episode in January with chest pain and shortness of breath and [facility].She thinks it is stress related.She still works for [employer] at home.She has third grandchild due in April.And she requests fasting labs, and she had mammogram this am.  PCP is Dr Dimas AguasHoward.    Current Medications, Allergies, Past Medical History, Past Surgical History, Family History and Social History were [facility].     Review of Systems: Patient denies any headaches, hearing loss, fatigue, blurred vision, abdominal pain, problems with bowel movements, urination, or intercourse. No joint pain or mood swings. See HPI for positives.    Physical Exam:BP 128/90 (BP Location: Left Arm, Patient Position: Sitting, Cuff Size: Large)   Pulse 89   Ht 5' 6.25" (1.683 m)   Wt 220 lb 8 oz (100 kg)   BMI 35.32 kg/m  General:  Well developed, well nourished, no acute distress Skin:  Warm and dry Neck:  Midline trachea, normal thyroid, good ROM, no lymphadenopathy Lungs; Clear to auscultation bilaterally Breast:  No dominant palpable mass, retraction, or nipple discharge Cardiovascular: Regular rate and rhythm Abdomen:  Soft, non tender, no hepatosplenomegaly Pelvic:  External genitalia is normal in appearance, no lesions.  The vagina is normal in appearance. Urethra has no lesions or masses. The cervix and uterua are absent. No adnexal masses or tenderness noted.Bladder is non tender, no masses felt. Rectal: Good sphincter tone, no polyps, or hemorrhoids felt.  Hemoccult negative. Extremities/musculoskeletal:  No swelling or varicosities noted, no clubbing or cyanosis Psych:  No mood changes, alert and cooperative,seems happy,but teary  PHQ 9 score 9, denies being  suicidal, but is stressed and open to meds, will Rx lexapro, and has xanax.  BP elevated will add lisinopril to hydrodiuril.   Impression: 1. Well woman exam with routine gynecological exam   2. Anxiety and depression   3. Elevated cholesterol with high triglycerides   4. Elevated hemoglobin A1c   5. Vitamin D deficiency   6. Screening for colorectal cancer   7. Elevated BP without diagnosis of hypertension       Plan: Check CBC,CMP,TSH and lipids,A1c and vitamin D Meds ordered this encounter  Medications  . lisinopril (PRINIVIL,ZESTRIL) 10 MG tablet    Sig: Take 1 tablet (10 mg total) by mouth daily.    Dispense:  30 tablet    Refill:  3    Order Specific Question:   Supervising Provider    Answer:   Despina HiddenEURE, LUTHER H [[id number]]  . escitalopram (LEXAPRO) 10 MG tablet    Sig: Take 1 tablet (10 mg total) by mouth daily.    Dispense:  30 tablet    Refill:  6    Order Specific Question:   Supervising Provider    Answer:   Despina HiddenEURE, LUTHER H [[id number]]  . ALPRAZolam (XANAX) 1 MG tablet    Sig: Take 1/2 to 1 tablet every 8 hours prn anxiety    Dispense:  60 tablet    Refill:  1    This prescription was filled on 10/31/2017. Any refills authorized will be placed on file.    Order Specific Question:   Supervising Provider    Answer:   Lazaro ArmsEURE, LUTHER H [[id number]]  Continue hydrodiuril as refill F/U  in 6 weeks on BP and lexapro Physical in 1 year Mammogram yearly Colonoscopy per GI

## 2017-12-10 LAB — COMPREHENSIVE METABOLIC PANEL
A/G RATIO: 2 (ref 1.2–2.2)
ALBUMIN: 5.1 g/dL (ref 3.5–5.5)
ALT: 18 IU/L (ref 0–32)
AST: 19 IU/L (ref 0–40)
Alkaline Phosphatase: 98 IU/L (ref 39–117)
BUN/Creatinine Ratio: 22 (ref 9–23)
BUN: 18 mg/dL (ref 6–24)
Bilirubin Total: 0.4 mg/dL (ref 0.0–1.2)
CALCIUM: 9.9 mg/dL (ref 8.7–10.2)
CO2: 25 mmol/L (ref 20–29)
CREATININE: 0.83 mg/dL (ref 0.57–1.00)
Chloride: 100 mmol/L (ref 96–106)
GFR calc Af Amer: 93 mL/min/{1.73_m2} (ref >59)
GFR, EST NON AFRICAN AMERICAN: 81 mL/min/{1.73_m2} (ref >59)
GLOBULIN, TOTAL: 2.6 g/dL (ref 1.5–4.5)
Glucose: 122 mg/dL — ABNORMAL HIGH (ref 65–99)
POTASSIUM: 3.8 mmol/L (ref 3.5–5.2)
SODIUM: 142 mmol/L (ref 134–144)
Total Protein: 7.7 g/dL (ref 6.0–8.5)

## 2017-12-10 LAB — CBC
HEMATOCRIT: 44 % (ref 34.0–46.6)
HEMOGLOBIN: 15.6 g/dL (ref 11.1–15.9)
MCH: 30.1 pg (ref 26.6–33.0)
MCHC: 35.5 g/dL (ref 31.5–35.7)
MCV: 85 fL (ref 79–97)
Platelets: 220 10*3/uL (ref 150–379)
RBC: 5.19 x10E6/uL (ref 3.77–5.28)
RDW: 14 % (ref 12.3–15.4)
WBC: 7.8 10*3/uL (ref 3.4–10.8)

## 2017-12-10 LAB — LIPID PANEL
CHOL/HDL RATIO: 4.7 ratio — AB (ref 0.0–4.4)
CHOLESTEROL TOTAL: 163 mg/dL (ref 100–199)
HDL: 35 mg/dL — ABNORMAL LOW (ref >39)
LDL CALC: 86 mg/dL (ref 0–99)
Triglycerides: 212 mg/dL — ABNORMAL HIGH (ref 0–149)
VLDL CHOLESTEROL CAL: 42 mg/dL — AB (ref 5–40)

## 2017-12-10 LAB — HEMOGLOBIN A1C
ESTIMATED AVERAGE GLUCOSE: 137 mg/dL
HEMOGLOBIN A1C: 6.4 % — AB (ref 4.8–5.6)

## 2017-12-10 LAB — VITAMIN D 25 HYDROXY (VIT D DEFICIENCY, FRACTURES): VIT D 25 HYDROXY: 38.6 ng/mL (ref 30.0–100.0)

## 2017-12-10 LAB — TSH: TSH: 1.56 u[IU]/mL (ref 0.450–4.500)

## 2017-12-12 ENCOUNTER — Telehealth: Payer: Self-pay | Admitting: Adult Health

## 2017-12-12 NOTE — Telephone Encounter (Signed)
Left message about labs, A1c 6.4, so prediabetic and needs to cut carbs and increase activity and recheck in 3 months, also BS 122,CBC and CMP good and TSH and vitamin D normal, cholesterol and triglycerides better but keep working on them too.Will repeat A1c in 3 months.

## 2018-01-20 ENCOUNTER — Ambulatory Visit (INDEPENDENT_AMBULATORY_CARE_PROVIDER_SITE_OTHER): Payer: 59 | Admitting: Adult Health

## 2018-01-20 ENCOUNTER — Encounter: Payer: Self-pay | Admitting: Adult Health

## 2018-01-20 ENCOUNTER — Other Ambulatory Visit: Payer: Self-pay

## 2018-01-20 VITALS — BP 102/70 | HR 83 | Ht 67.0 in | Wt 215.0 lb

## 2018-01-20 DIAGNOSIS — F329 Major depressive disorder, single episode, unspecified: Secondary | ICD-10-CM

## 2018-01-20 DIAGNOSIS — F419 Anxiety disorder, unspecified: Secondary | ICD-10-CM | POA: Diagnosis not present

## 2018-01-20 DIAGNOSIS — I10 Essential (primary) hypertension: Secondary | ICD-10-CM

## 2018-01-20 DIAGNOSIS — F32A Depression, unspecified: Secondary | ICD-10-CM

## 2018-01-20 NOTE — Progress Notes (Signed)
Subjective:     Patient ID: Alicia Gross, female   DOB: July 07, 1966, 52 y.o.   MRN: [id number]  HPI [name] is a 52 year old white female, back in follow up for BP check and she how is she on lexapro, and is better. She started weight watchers.  Review of Systems Feels better, no panic like feeling Dizzy in am at times, but better after drinking water Reviewed past medical,surgical, social and family history. Reviewed medications and allergies.     Objective:   Physical Exam BP 102/70 (BP Location: Right Arm, Patient Position: Sitting, Cuff Size: Large)   Pulse 83   Ht 5\' 7"  (1.702 m)   Wt 215 lb (97.5 kg)   BMI 33.67 kg/m  Skin warm and dry. Lungs: clear to ausculation bilaterally. Cardiovascular: regular rate and rhythm.   Has lost 5.5 lbs, PHQ 9 score 4, which is better was 9 12/09/17. Will continue BP meds and lexapro and recheck in 2 months, may reduce BP meds then, if still losing weight and BP good.  Assessment:     1. Anxiety and depression   2. Essential hypertension       Plan:     Continue meds Continue weight loss F/U in 2 months and will check A1c then

## 2018-02-01 ENCOUNTER — Other Ambulatory Visit: Payer: Self-pay | Admitting: Adult Health

## 2018-03-01 ENCOUNTER — Other Ambulatory Visit: Payer: Self-pay | Admitting: Adult Health

## 2018-03-23 ENCOUNTER — Ambulatory Visit: Payer: 59 | Admitting: Adult Health

## 2018-03-28 ENCOUNTER — Ambulatory Visit (INDEPENDENT_AMBULATORY_CARE_PROVIDER_SITE_OTHER): Payer: 59 | Admitting: Adult Health

## 2018-03-28 ENCOUNTER — Telehealth: Payer: Self-pay | Admitting: Adult Health

## 2018-03-28 ENCOUNTER — Encounter: Payer: Self-pay | Admitting: Adult Health

## 2018-03-28 VITALS — BP 128/60 | HR 82 | Ht 67.0 in | Wt 204.0 lb

## 2018-03-28 DIAGNOSIS — M5441 Lumbago with sciatica, right side: Secondary | ICD-10-CM

## 2018-03-28 DIAGNOSIS — M549 Dorsalgia, unspecified: Secondary | ICD-10-CM | POA: Diagnosis not present

## 2018-03-28 DIAGNOSIS — I10 Essential (primary) hypertension: Secondary | ICD-10-CM

## 2018-03-28 DIAGNOSIS — F418 Other specified anxiety disorders: Secondary | ICD-10-CM

## 2018-03-28 DIAGNOSIS — F32A Depression, unspecified: Secondary | ICD-10-CM

## 2018-03-28 DIAGNOSIS — F329 Major depressive disorder, single episode, unspecified: Secondary | ICD-10-CM

## 2018-03-28 DIAGNOSIS — M5442 Lumbago with sciatica, left side: Secondary | ICD-10-CM

## 2018-03-28 DIAGNOSIS — R7309 Other abnormal glucose: Secondary | ICD-10-CM

## 2018-03-28 DIAGNOSIS — F419 Anxiety disorder, unspecified: Secondary | ICD-10-CM

## 2018-03-28 NOTE — Telephone Encounter (Signed)
Pt called and has appt with Dr Lovell Sheehan 04/25/18, and she said he would like MRI before she is seen, MRI scheduled at [facility], 6/4 at 5 pm

## 2018-03-28 NOTE — Progress Notes (Signed)
  Subjective:     Patient ID: Alicia Gross, female   DOB: [birth year], 52 y.o.   MRN: [id number]  HPI Alicia Gross is a 52 year old white female, married, in for BP check and see if lexapro still working well, and it is,Cleaned her Moms carpet last week and now has some back pain radiating down legs.   Review of Systems +back pain for about 4 days, stiff and pain going down her legs Feels better emotionally, still feels angry at times Reviewed past medical,surgical, social and family history. Reviewed medications and allergies.     Objective:   Physical Exam BP 128/60 (BP Location: Left Arm, Patient Position: Sitting, Cuff Size: Normal)   Pulse 82   Ht  (1.702 m)   Wt 204 lb (92.5 kg)   BMI 31.95 kg/m  Skin warm and dry. Lungs: clear to ausculation bilaterally. Cardiovascular: regular rate and rhythm.   Has lost 21 lbs, PHQ 9 score 2, which is better.  Assessment:     1. Essential hypertension   2. Anxiety and depression   3. Elevated hemoglobin A1c       Plan:     Continue BP meds and lexapro Check A1c today Continue weight loss Call back doctor  F/U in 3 months

## 2018-03-29 LAB — HEMOGLOBIN A1C
ESTIMATED AVERAGE GLUCOSE: 114 mg/dL
HEMOGLOBIN A1C: 5.6 % (ref 4.8–5.6)

## 2018-03-31 ENCOUNTER — Other Ambulatory Visit: Payer: Self-pay | Admitting: Adult Health

## 2018-04-04 ENCOUNTER — Ambulatory Visit (HOSPITAL_COMMUNITY)
Admission: RE | Admit: 2018-04-04 | Discharge: 2018-04-04 | Disposition: A | Discharge status: Home / Self Care | Payer: 59 | Source: Ambulatory Visit | Attending: Adult Health | Admitting: Adult Health

## 2018-04-04 DIAGNOSIS — M5136 Other intervertebral disc degeneration, lumbar region: Secondary | ICD-10-CM | POA: Insufficient documentation

## 2018-04-04 DIAGNOSIS — M5441 Lumbago with sciatica, right side: Secondary | ICD-10-CM | POA: Diagnosis not present

## 2018-04-04 DIAGNOSIS — M5127 Other intervertebral disc displacement, lumbosacral region: Secondary | ICD-10-CM | POA: Diagnosis not present

## 2018-04-04 DIAGNOSIS — M5442 Lumbago with sciatica, left side: Secondary | ICD-10-CM

## 2018-04-04 DIAGNOSIS — M5126 Other intervertebral disc displacement, lumbar region: Secondary | ICD-10-CM | POA: Insufficient documentation

## 2018-04-05 ENCOUNTER — Other Ambulatory Visit: Payer: Self-pay | Admitting: Adult Health

## 2018-04-05 ENCOUNTER — Telehealth: Payer: Self-pay | Admitting: Adult Health

## 2018-04-05 DIAGNOSIS — R200 Anesthesia of skin: Secondary | ICD-10-CM

## 2018-04-05 DIAGNOSIS — Z9889 Other specified postprocedural states: Secondary | ICD-10-CM

## 2018-04-05 NOTE — Telephone Encounter (Signed)
Pt aware of results of MRI of lumbar, has appt with her back doctor this months, now having numbness right hand and weakness, has fusion of C 3-4 about 10 years ago, will get MRI w/o contrast or cervical spine in near future

## 2018-04-05 NOTE — Progress Notes (Signed)
[facility]Angie to pre cert, pt had fusion C3-4 about 10 years ago and has numbness in right hand and some weakness at times.

## 2018-04-19 ENCOUNTER — Telehealth: Payer: Self-pay | Admitting: Adult Health

## 2018-04-19 ENCOUNTER — Ambulatory Visit (HOSPITAL_COMMUNITY)
Admission: RE | Admit: 2018-04-19 | Discharge: 2018-04-19 | Disposition: A | Discharge status: Home / Self Care | Payer: 59 | Source: Ambulatory Visit | Attending: Adult Health | Admitting: Adult Health

## 2018-04-19 DIAGNOSIS — M2578 Osteophyte, vertebrae: Secondary | ICD-10-CM | POA: Diagnosis not present

## 2018-04-19 DIAGNOSIS — R200 Anesthesia of skin: Secondary | ICD-10-CM | POA: Diagnosis present

## 2018-04-19 DIAGNOSIS — Z981 Arthrodesis status: Secondary | ICD-10-CM | POA: Insufficient documentation

## 2018-04-19 DIAGNOSIS — M4802 Spinal stenosis, cervical region: Secondary | ICD-10-CM | POA: Diagnosis not present

## 2018-04-19 DIAGNOSIS — Z9889 Other specified postprocedural states: Secondary | ICD-10-CM | POA: Diagnosis present

## 2018-04-19 NOTE — Telephone Encounter (Signed)
Pt aware of MRI results, has appt with neurosurgeon 6/25

## 2018-05-26 ENCOUNTER — Other Ambulatory Visit: Payer: Self-pay | Admitting: Adult Health

## 2018-06-01 DIAGNOSIS — W57XXXA Bitten or stung by nonvenomous insect and other nonvenomous arthropods, initial encounter: Secondary | ICD-10-CM

## 2018-06-01 HISTORY — DX: Bitten or stung by nonvenomous insect and other nonvenomous arthropods, initial encounter: W57.XXXA

## 2018-06-28 ENCOUNTER — Encounter: Payer: Self-pay | Admitting: Adult Health

## 2018-06-28 ENCOUNTER — Other Ambulatory Visit: Payer: Self-pay | Admitting: Women's Health

## 2018-06-28 ENCOUNTER — Ambulatory Visit (INDEPENDENT_AMBULATORY_CARE_PROVIDER_SITE_OTHER): Payer: 59 | Admitting: Adult Health

## 2018-06-28 VITALS — BP 102/61 | HR 83 | Ht 67.0 in | Wt 206.0 lb

## 2018-06-28 DIAGNOSIS — F419 Anxiety disorder, unspecified: Secondary | ICD-10-CM | POA: Diagnosis not present

## 2018-06-28 DIAGNOSIS — F32A Depression, unspecified: Secondary | ICD-10-CM

## 2018-06-28 DIAGNOSIS — F329 Major depressive disorder, single episode, unspecified: Secondary | ICD-10-CM | POA: Diagnosis not present

## 2018-06-28 DIAGNOSIS — I10 Essential (primary) hypertension: Secondary | ICD-10-CM

## 2018-06-28 NOTE — Progress Notes (Signed)
  Subjective:     Patient ID: Alicia Gross, female   DOB: 1966/03/18, 52 y.o.   MRN: [id number]  HPI Alicia Gross is a 52 year old white female,married, in for BP check.She is having neck surgery November 14,2019. She gained 2 lbs, but had birthday cake the weekend.  She is active, she walks in the mornings and plays with grand kids.  Review of Systems +tired Legs restless at times  Feels stressed some days Reviewed past medical,surgical, social and family history. Reviewed medications and allergies.     Objective:   Physical Exam BP 102/61 (BP Location: Left Arm, Patient Position: Sitting, Cuff Size: Normal)   Pulse 83   Ht 5\' 7"  (1.702 m)   Wt 206 lb (93.4 kg)   BMI 32.26 kg/m  Skin warm and dry. Lungs: clear to ausculation bilaterally. Cardiovascular: regular rate and rhythm.    Assessment:     1. Essential hypertension   2. Anxiety and depression       Plan:     Take 1/2 lisinopril and continue Hydrodiuril Can take 2 lexapro if needed F/U in 8 weeks for BP check

## 2018-06-29 ENCOUNTER — Other Ambulatory Visit: Payer: Self-pay | Admitting: Adult Health

## 2018-06-29 MED ORDER — HYDROCHLOROTHIAZIDE 50 MG PO TABS: ORAL_TABLET | ORAL | 3 refills | Status: DC

## 2018-06-29 NOTE — Telephone Encounter (Signed)
Patient called stating that she had her pharmacy [facility] but it was accidentally sent to [facility]. Pt would like to know if we could forward that refill prescription to [facility]. Pt states that she is going out of town this afternoon and need this refill before then. Please contact pt

## 2018-08-23 ENCOUNTER — Ambulatory Visit (INDEPENDENT_AMBULATORY_CARE_PROVIDER_SITE_OTHER): Payer: 59 | Admitting: Adult Health

## 2018-08-23 ENCOUNTER — Encounter: Payer: Self-pay | Admitting: Adult Health

## 2018-08-23 VITALS — BP 120/71 | HR 82 | Ht 67.0 in | Wt 204.0 lb

## 2018-08-23 DIAGNOSIS — F329 Major depressive disorder, single episode, unspecified: Secondary | ICD-10-CM | POA: Diagnosis not present

## 2018-08-23 DIAGNOSIS — I10 Essential (primary) hypertension: Secondary | ICD-10-CM

## 2018-08-23 DIAGNOSIS — F419 Anxiety disorder, unspecified: Secondary | ICD-10-CM

## 2018-08-23 DIAGNOSIS — F32A Depression, unspecified: Secondary | ICD-10-CM

## 2018-08-23 MED ORDER — ESCITALOPRAM OXALATE 20 MG PO TABS: 20.0000 mg | ORAL_TABLET | Freq: Every day | ORAL | 3 refills | Status: DC

## 2018-08-23 NOTE — Progress Notes (Addendum)
  Subjective:     Patient ID: Alicia Gross, female   DOB: 1966-01-22, 52 y.o.   MRN: [id number]  HPI Alicia Gross is a 52 year old white female, married, sp hysterectomy in for BP check and is good, has not taken lisinopril for about 2 weeks had vertigo, and BP good at home.She has up coming back surgery in November and now daughter is having surgery a week later.  Review of Systems Anxious at times Has had vertigo recently Reviewed past medical,surgical, social and family history. Reviewed medications and allergies.     Objective:   Physical Exam BP 120/71 (BP Location: Left Arm, Patient Position: Sitting, Cuff Size: Normal)   Pulse 82   Ht 5\' 7"  (1.702 m)   Wt 204 lb (92.5 kg)   BMI 31.95 kg/m she has lost 2 lbs that she had gained.Skin warm and dry.  Lungs: clear to ausculation bilaterally. Cardiovascular: regular rate and rhythm. PHQ 2 score 0.    Assessment:     1. Essential hypertension   2. Anxiety and depression       Plan:     Can not take lisinopril for now, but continue hydrodiuril Will increase lexapro to 20 mg Meds ordered this encounter  Medications  . escitalopram (LEXAPRO) 20 MG tablet    Sig: Take 1 tablet (20 mg total) by mouth daily.    Dispense:  90 tablet    Refill:  3    Order Specific Question:   Supervising Provider    Answer:   Duane Lope H [[id number]]  F/U in 3 months of sooner if needed

## 2018-10-10 ENCOUNTER — Other Ambulatory Visit: Payer: Self-pay | Admitting: Adult Health

## 2018-11-09 ENCOUNTER — Other Ambulatory Visit (HOSPITAL_COMMUNITY): Payer: Self-pay | Admitting: Obstetrics and Gynecology

## 2018-11-09 DIAGNOSIS — Z1231 Encounter for screening mammogram for malignant neoplasm of breast: Secondary | ICD-10-CM

## 2018-11-27 ENCOUNTER — Other Ambulatory Visit: Payer: Self-pay | Admitting: Adult Health

## 2018-12-11 ENCOUNTER — Other Ambulatory Visit: Payer: 59 | Admitting: Adult Health

## 2018-12-11 ENCOUNTER — Ambulatory Visit (HOSPITAL_COMMUNITY)
Admission: RE | Admit: 2018-12-11 | Discharge: 2018-12-11 | Disposition: A | Discharge status: Home / Self Care | Payer: 59 | Source: Ambulatory Visit | Attending: Obstetrics and Gynecology | Admitting: Obstetrics and Gynecology

## 2018-12-11 DIAGNOSIS — Z1231 Encounter for screening mammogram for malignant neoplasm of breast: Secondary | ICD-10-CM | POA: Diagnosis not present

## 2018-12-13 ENCOUNTER — Ambulatory Visit (INDEPENDENT_AMBULATORY_CARE_PROVIDER_SITE_OTHER): Payer: 59 | Admitting: Adult Health

## 2018-12-13 ENCOUNTER — Encounter: Payer: Self-pay | Admitting: Adult Health

## 2018-12-13 VITALS — BP 123/80 | HR 80 | Ht 67.0 in | Wt 206.0 lb

## 2018-12-13 DIAGNOSIS — R7309 Other abnormal glucose: Secondary | ICD-10-CM

## 2018-12-13 DIAGNOSIS — F329 Major depressive disorder, single episode, unspecified: Secondary | ICD-10-CM

## 2018-12-13 DIAGNOSIS — E782 Mixed hyperlipidemia: Secondary | ICD-10-CM

## 2018-12-13 DIAGNOSIS — Z01419 Encounter for gynecological examination (general) (routine) without abnormal findings: Secondary | ICD-10-CM

## 2018-12-13 DIAGNOSIS — Z1211 Encounter for screening for malignant neoplasm of colon: Secondary | ICD-10-CM

## 2018-12-13 DIAGNOSIS — Z1212 Encounter for screening for malignant neoplasm of rectum: Secondary | ICD-10-CM

## 2018-12-13 DIAGNOSIS — I10 Essential (primary) hypertension: Secondary | ICD-10-CM

## 2018-12-13 DIAGNOSIS — F419 Anxiety disorder, unspecified: Secondary | ICD-10-CM

## 2018-12-13 DIAGNOSIS — F32A Depression, unspecified: Secondary | ICD-10-CM

## 2018-12-13 LAB — HEMOCCULT GUIAC POC 1CARD (OFFICE): Fecal Occult Blood, POC: NEGATIVE

## 2018-12-13 NOTE — Progress Notes (Signed)
Patient ID: Alicia Gross, female   DOB: 12-28-1965, 53 y.o.   MRN: [id number] History of Present Illness: Alicia Gross is a 53 year old white female, married, sp hysterectomy, recently had neck surgery in for well woman gyn exam. 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  PCP is Dr Dimas Aguas.   Current Medications, Allergies, Past Medical History, Past Surgical History, Family History and Social History were reviewed in [employer] record.     Review of Systems: Patient denies any headaches, hearing loss, fatigue, blurred vision, shortness of breath, chest pain, abdominal pain, problems with bowel movements, urination, or intercourse. No joint pain or mood swings. +hot flashes and night sweats. She had neck surgery 09/2018 and is still on SDB.    Physical Exam:BP 123/80 (BP Location: Left Arm, Patient Position: Sitting, Cuff Size: Normal)   Pulse 80   Ht 5\' 7"  (1.702 m)   Wt 206 lb (93.4 kg)   BMI 32.26 kg/m  General:  Well developed, well nourished, no acute distress Skin:  Warm and dry,healed scar, still tender Neck:  Midline trachea, normal thyroid, good ROM, no lymphadenopathy Lungs; Clear to auscultation bilaterally Breast:  No dominant palpable mass, retraction, or nipple discharge Cardiovascular: Regular rate and rhythm Abdomen:  Soft, non tender, no hepatosplenomegaly Pelvic:  External genitalia is normal in appearance, no lesions.  The vagina is normal in appearance for age with some loss of color and rugae.Marland Kitchen Urethra has no lesions or masses. The cervix and cervix are absent.  No adnexal masses or tenderness noted.Bladder is non tender, no masses felt. Rectal: Good sphincter tone, no polyps, or hemorrhoids felt.  Hemoccult negative. Extremities/musculoskeletal:  No swelling or varicosities noted, no clubbing or cyanosis Psych:  No mood changes, alert and cooperative,seems happy Fall risk is low. PHQ 2 score 0. Examination chaperoned by Francene Finders RN. She  requests fasting labs.  Impression: 1. Well woman exam with routine gynecological exam   2. Elevated hemoglobin A1c   3. Essential hypertension   4. Anxiety and depression   5. Elevated cholesterol with high triglycerides   6. Screening for colorectal cancer       Plan: Check CBC,CMP,TSH and lipids,A1c Had mammogram 2/10, was negative for malignancy, has dense tissue Physical in 1 year Continue meds has refills

## 2018-12-14 ENCOUNTER — Telehealth: Payer: Self-pay | Admitting: Adult Health

## 2018-12-14 LAB — COMPREHENSIVE METABOLIC PANEL
ALT: 15 IU/L (ref 0–32)
AST: 20 IU/L (ref 0–40)
Albumin/Globulin Ratio: 2.2 (ref 1.2–2.2)
Albumin: 4.8 g/dL (ref 3.8–4.9)
Alkaline Phosphatase: 102 IU/L (ref 39–117)
BILIRUBIN TOTAL: 0.5 mg/dL (ref 0.0–1.2)
BUN / CREAT RATIO: 19 (ref 9–23)
BUN: 15 mg/dL (ref 6–24)
CALCIUM: 9.6 mg/dL (ref 8.7–10.2)
CHLORIDE: 99 mmol/L (ref 96–106)
CO2: 26 mmol/L (ref 20–29)
Creatinine, Ser: 0.81 mg/dL (ref 0.57–1.00)
GFR calc Af Amer: 95 mL/min/{1.73_m2} (ref >59)
GFR, EST NON AFRICAN AMERICAN: 82 mL/min/{1.73_m2} (ref >59)
GLUCOSE: 105 mg/dL — AB (ref 65–99)
Globulin, Total: 2.2 g/dL (ref 1.5–4.5)
Potassium: 3.7 mmol/L (ref 3.5–5.2)
Sodium: 143 mmol/L (ref 134–144)
TOTAL PROTEIN: 7 g/dL (ref 6.0–8.5)

## 2018-12-14 LAB — CBC
Hematocrit: 43.7 % (ref 34.0–46.6)
Hemoglobin: 14.5 g/dL (ref 11.1–15.9)
MCH: 28.8 pg (ref 26.6–33.0)
MCHC: 33.2 g/dL (ref 31.5–35.7)
MCV: 87 fL (ref 79–97)
Platelets: 186 10*3/uL (ref 150–450)
RBC: 5.03 x10E6/uL (ref 3.77–5.28)
RDW: 14 % (ref 11.7–15.4)
WBC: 7 10*3/uL (ref 3.4–10.8)

## 2018-12-14 LAB — LIPID PANEL
Chol/HDL Ratio: 5.2 ratio — ABNORMAL HIGH (ref 0.0–4.4)
Cholesterol, Total: 181 mg/dL (ref 100–199)
HDL: 35 mg/dL — ABNORMAL LOW (ref >39)
LDL CALC: 99 mg/dL (ref 0–99)
TRIGLYCERIDES: 234 mg/dL — AB (ref 0–149)
VLDL Cholesterol Cal: 47 mg/dL — ABNORMAL HIGH (ref 5–40)

## 2018-12-14 LAB — TSH: TSH: 1.83 u[IU]/mL (ref 0.450–4.500)

## 2018-12-14 LAB — HEMOGLOBIN A1C
ESTIMATED AVERAGE GLUCOSE: 117 mg/dL
Hgb A1c MFr Bld: 5.7 % — ABNORMAL HIGH (ref 4.8–5.6)

## 2018-12-14 NOTE — Telephone Encounter (Signed)
Pt aware of labs  

## 2019-02-01 ENCOUNTER — Other Ambulatory Visit: Payer: Self-pay | Admitting: Adult Health

## 2019-04-04 ENCOUNTER — Telehealth: Payer: Self-pay | Admitting: Adult Health

## 2019-04-04 MED ORDER — ALPRAZOLAM 1 MG PO TABS
ORAL_TABLET | ORAL | 0 refills | Status: DC
Start: 2019-04-04 — End: 2019-05-07

## 2019-04-04 NOTE — Telephone Encounter (Signed)
Requests refill on xanax, done

## 2019-04-18 ENCOUNTER — Encounter: Payer: Self-pay | Admitting: Adult Health

## 2019-04-18 ENCOUNTER — Other Ambulatory Visit: Payer: Self-pay

## 2019-04-18 ENCOUNTER — Ambulatory Visit (INDEPENDENT_AMBULATORY_CARE_PROVIDER_SITE_OTHER): Payer: 59 | Admitting: Adult Health

## 2019-04-18 VITALS — Ht 67.0 in | Wt 213.0 lb

## 2019-04-18 DIAGNOSIS — G2581 Restless legs syndrome: Secondary | ICD-10-CM

## 2019-04-18 MED ORDER — ROPINIROLE HCL 0.5 MG PO TABS: ORAL_TABLET | ORAL | 1 refills | Status: DC

## 2019-04-18 NOTE — Progress Notes (Signed)
Patient ID: Alicia Gross, female   DOB: 10-25-1966, 53 y.o.   MRN: [id number]   TELEHEALTH VIRTUAL GYNECOLOGY VISIT ENCOUNTER NOTE  I connected with Alicia Gross on 04/18/19 at 12:00 PM EDT by telephone at home and verified that I am speaking with the correct person using two identifiers.   I discussed the limitations, risks, security and privacy concerns of performing an evaluation and management service by telephone and the availability of in person appointments. I also discussed with the patient that there may be a patient responsible charge related to this service. The patient expressed understanding and agreed to proceed.   History:  Alicia Gross is a 53 y.o. G2P2002,white female, married, sp hysterectomy being evaluated today for restless legs, at night, and is affecting sleep, did try pickle juice and it helped some, but hands swell, when drinks it.. She denies any  other concerns.   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 PCP is Dr Nadara Mustard.      Past Medical History:  Diagnosis Date  . Anxiety   . Constipation   . Elevated cholesterol   . Elevated cholesterol with high triglycerides 12/13/2016  . Elevated hemoglobin A1c 12/13/2016  . Mental disorder    anxiety  . Obesity 09/09/2014  . Rosacea   . Shingles   . Tick bite 06/2018  . Vitamin D deficiency 12/13/2016   Past Surgical History:  Procedure Laterality Date  . ABDOMINAL HYSTERECTOMY    . BUNIONECTOMY    . CERVICAL DISCECTOMY    . COLONOSCOPY    . COLONOSCOPY N/A 11/26/2015   Procedure: COLONOSCOPY;  Surgeon: Rogene Houston, MD;  [facility]: [facility];  Service: Endoscopy;  Laterality: N/A;  1030  . LUMBAR DISC SURGERY    . SKIN BIOPSY  10/2016   The following portions of the patient's history were reviewed and updated as appropriate: allergies, current medications, past family history, past medical history, past social history, past surgical history and problem list.   Health Maintenance:  Normal mammogram on  12/11/18  Review of Systems:  Pertinent items noted in HPI and remainder of comprehensive ROS otherwise negative.  Physical Exam:   General:  Alert, oriented and cooperative.   Mental Status: Normal mood and affect perceived. Normal judgment and thought content.  Physical exam deferred due to nature of the encounter Ht 5\' 7"  (1.702 m)   Wt 213 lb (96.6 kg)   BMI 33.36 kg/m per pt. Fall risk is low  Labs and Imaging No results found for this or any previous visit (from the past 336 hour(s)). No results found.    Assessment and Plan:     1. Restless leg syndrome -will rx requip Meds ordered this encounter  Medications  . rOPINIRole (REQUIP) 0.5 MG tablet    Sig: Take 1/2 tablet at hs for 2 days then 1 tablet at hs    Dispense:  30 tablet    Refill:  1    Please delivery to her home    Order Specific Question:   Supervising Provider    Answer:   Tania Ade H [[id number]]  Follow up prn, but call me if has to titrate higher.       I discussed the assessment and treatment plan with the patient. The patient was provided an opportunity to ask questions and all were answered. The patient agreed with the plan and demonstrated an understanding of the instructions.   The patient was advised to call  back or seek an in-person evaluation/go to the ED if the symptoms worsen or if the condition fails to improve as anticipated.  I provided 11 minutes of non-face-to-face time during this encounter.   Cyril MourningJennifer , NP [facility]

## 2019-05-07 ENCOUNTER — Other Ambulatory Visit: Payer: Self-pay | Admitting: Adult Health

## 2019-06-08 ENCOUNTER — Other Ambulatory Visit: Payer: Self-pay | Admitting: Women's Health

## 2019-07-05 ENCOUNTER — Other Ambulatory Visit: Payer: Self-pay | Admitting: Adult Health

## 2019-07-05 ENCOUNTER — Other Ambulatory Visit: Payer: Self-pay | Admitting: Women's Health

## 2019-07-05 MED ORDER — ALPRAZOLAM 1 MG PO TABS: ORAL_TABLET | ORAL | 0 refills | Status: DC

## 2019-08-07 ENCOUNTER — Other Ambulatory Visit: Payer: Self-pay | Admitting: Adult Health

## 2019-08-27 ENCOUNTER — Other Ambulatory Visit: Payer: Self-pay | Admitting: Women's Health

## 2019-08-28 ENCOUNTER — Telehealth: Payer: Self-pay | Admitting: *Deleted

## 2019-08-28 ENCOUNTER — Other Ambulatory Visit: Payer: Self-pay | Admitting: Women's Health

## 2019-08-28 MED ORDER — ALPRAZOLAM 1 MG PO TABS: ORAL_TABLET | ORAL | 0 refills | Status: DC

## 2019-08-28 NOTE — Telephone Encounter (Signed)
Pt requesting refill on Xanax. 

## 2019-08-28 NOTE — Telephone Encounter (Signed)
Refill xanax

## 2019-09-19 ENCOUNTER — Other Ambulatory Visit: Payer: Self-pay

## 2019-09-19 DIAGNOSIS — Z20822 Contact with and (suspected) exposure to covid-19: Secondary | ICD-10-CM

## 2019-09-20 LAB — NOVEL CORONAVIRUS, NAA: SARS-CoV-2, NAA: NOT DETECTED

## 2019-09-20 LAB — SPECIMEN STATUS REPORT

## 2019-10-02 ENCOUNTER — Other Ambulatory Visit: Payer: Self-pay

## 2019-10-02 DIAGNOSIS — Z20822 Contact with and (suspected) exposure to covid-19: Secondary | ICD-10-CM

## 2019-10-04 LAB — NOVEL CORONAVIRUS, NAA: SARS-CoV-2, NAA: NOT DETECTED

## 2019-10-23 ENCOUNTER — Other Ambulatory Visit: Payer: Self-pay | Admitting: *Deleted

## 2019-10-23 MED ORDER — ALPRAZOLAM 1 MG PO TABS: ORAL_TABLET | ORAL | 0 refills | Status: DC

## 2019-11-28 ENCOUNTER — Other Ambulatory Visit (HOSPITAL_COMMUNITY): Payer: Self-pay | Admitting: Obstetrics and Gynecology

## 2019-11-28 DIAGNOSIS — Z1231 Encounter for screening mammogram for malignant neoplasm of breast: Secondary | ICD-10-CM

## 2019-12-10 ENCOUNTER — Other Ambulatory Visit: Payer: Self-pay | Admitting: Women's Health

## 2020-01-08 ENCOUNTER — Telehealth: Payer: Self-pay | Admitting: Adult Health

## 2020-01-08 DIAGNOSIS — R140 Abdominal distension (gaseous): Secondary | ICD-10-CM

## 2020-01-08 DIAGNOSIS — R110 Nausea: Secondary | ICD-10-CM

## 2020-01-08 DIAGNOSIS — R1011 Right upper quadrant pain: Secondary | ICD-10-CM

## 2020-01-08 NOTE — Telephone Encounter (Signed)
[place] has appt next week, but is having RUQ pain bloating and nausea, will get abd [place] at [facility] 3/15 at 7:30 am, but there 15 minutes early and NPO

## 2020-01-14 ENCOUNTER — Other Ambulatory Visit: Payer: Self-pay

## 2020-01-14 ENCOUNTER — Ambulatory Visit (HOSPITAL_COMMUNITY)
Admission: RE | Admit: 2020-01-14 | Discharge: 2020-01-14 | Disposition: A | Discharge status: Home / Self Care | Payer: 59 | Source: Ambulatory Visit | Attending: Adult Health | Admitting: Adult Health

## 2020-01-14 DIAGNOSIS — R1011 Right upper quadrant pain: Secondary | ICD-10-CM

## 2020-01-14 DIAGNOSIS — R140 Abdominal distension (gaseous): Secondary | ICD-10-CM | POA: Insufficient documentation

## 2020-01-14 DIAGNOSIS — R110 Nausea: Secondary | ICD-10-CM | POA: Insufficient documentation

## 2020-01-16 ENCOUNTER — Ambulatory Visit (INDEPENDENT_AMBULATORY_CARE_PROVIDER_SITE_OTHER): Payer: 59 | Admitting: Adult Health

## 2020-01-16 ENCOUNTER — Encounter: Payer: Self-pay | Admitting: Adult Health

## 2020-01-16 ENCOUNTER — Ambulatory Visit (HOSPITAL_COMMUNITY)
Admission: RE | Admit: 2020-01-16 | Discharge: 2020-01-16 | Disposition: A | Discharge status: Home / Self Care | Payer: 59 | Source: Ambulatory Visit | Attending: Obstetrics and Gynecology | Admitting: Obstetrics and Gynecology

## 2020-01-16 ENCOUNTER — Other Ambulatory Visit: Payer: Self-pay

## 2020-01-16 VITALS — BP 109/69 | HR 80 | Ht 67.0 in | Wt 210.4 lb

## 2020-01-16 DIAGNOSIS — F419 Anxiety disorder, unspecified: Secondary | ICD-10-CM

## 2020-01-16 DIAGNOSIS — R1011 Right upper quadrant pain: Secondary | ICD-10-CM | POA: Diagnosis not present

## 2020-01-16 DIAGNOSIS — K828 Other specified diseases of gallbladder: Secondary | ICD-10-CM | POA: Diagnosis not present

## 2020-01-16 DIAGNOSIS — F329 Major depressive disorder, single episode, unspecified: Secondary | ICD-10-CM

## 2020-01-16 DIAGNOSIS — Z01419 Encounter for gynecological examination (general) (routine) without abnormal findings: Secondary | ICD-10-CM

## 2020-01-16 DIAGNOSIS — Z1212 Encounter for screening for malignant neoplasm of rectum: Secondary | ICD-10-CM

## 2020-01-16 DIAGNOSIS — Z1211 Encounter for screening for malignant neoplasm of colon: Secondary | ICD-10-CM

## 2020-01-16 DIAGNOSIS — Z1231 Encounter for screening mammogram for malignant neoplasm of breast: Secondary | ICD-10-CM | POA: Diagnosis present

## 2020-01-16 DIAGNOSIS — I10 Essential (primary) hypertension: Secondary | ICD-10-CM

## 2020-01-16 DIAGNOSIS — R140 Abdominal distension (gaseous): Secondary | ICD-10-CM

## 2020-01-16 DIAGNOSIS — F32A Depression, unspecified: Secondary | ICD-10-CM

## 2020-01-16 DIAGNOSIS — R110 Nausea: Secondary | ICD-10-CM | POA: Insufficient documentation

## 2020-01-16 LAB — HEMOCCULT GUIAC POC 1CARD (OFFICE): Fecal Occult Blood, POC: NEGATIVE

## 2020-01-16 MED ORDER — ONDANSETRON HCL 8 MG PO TABS: 8.0000 mg | ORAL_TABLET | Freq: Three times a day (TID) | ORAL | 1 refills | Status: DC | PRN

## 2020-01-16 NOTE — Progress Notes (Signed)
Patient ID: Alicia Gross, female   DOB: 29-Mar-1966, 54 y.o.   MRN: [id number] History of Present Illness: Alicia Gross is a 54 year old white female,married, sp hysterectomy in for well woman gyn exam.She had labs for insurance.Trigycleriides 260,A1c is 5.6 and LFTs normal. She had GB US 3/15 has  Gallbladder sludge and fatty liver. PCP is [facility]   Current Medications, Allergies, Past Medical History, Past Surgical History, Family History and Social History were reviewed in [employer] record.     Review of Systems: Patient denies any headaches, hearing loss, fatigue, blurred vision, shortness of breath, chest pain, problems with bowel movements, urination, or intercourse. No joint pain or mood swings. +blaoting and nausea and RUQ pain at times    Physical Exam:BP 109/69 (BP Location: Left Arm, Patient Position: Sitting, Cuff Size: Normal)   Pulse 80   Ht 5\' 7"  (1.702 m)   Wt 210 lb 6.4 oz (95.4 kg)   BMI 32.95 kg/m  General:  Well developed, well nourished, no acute distress Skin:  Warm and dry Neck:  Midline trachea, normal thyroid, good ROM, no lymphadenopathy Lungs; Clear to auscultation bilaterally Breast:  No dominant palpable mass, retraction, or nipple discharge Cardiovascular: Regular rate and rhythm Abdomen:  Soft,no hepatosplenomegaly,mildly tender RUQ Pelvic:  External genitalia is normal in appearance, no lesions.  The vagina is normal in appearance. Urethra has no lesions or masses. The cervix and uterus are absent. No adnexal masses or tenderness noted.Bladder is non tender, no masses felt. Rectal: Good sphincter tone, no polyps, or hemorrhoids felt.  Hemoccult negative. Extremities/musculoskeletal:  No swelling or varicosities noted, no clubbing or cyanosis Psych:  No mood changes, alert and cooperative,seems happy Fall risk is low PHQ 2 score is 0. Examination chaperoned by CMA  Impression and Plan: 1. Encounter for well woman  exam with routine gynecological exam Physical in 1 year Has mammogram today Saw dentist and eye doctor today too   2. Screening for colorectal cancer Colonoscopy per GI  3. Gallbladder sludge Referred to Dr Ishmael Holter  4. RUQ pain Referred to Dr Henreitta Leber   5. Bloating symptom Referred to Dr Henreitta Leber   6. Nausea Rx Zofran Meds ordered this encounter  Medications  . ondansetron (ZOFRAN) 8 MG tablet    Sig: Take 1 tablet (8 mg total) by mouth every 8 (eight) hours as needed for nausea or vomiting.    Dispense:  20 tablet    Refill:  1    Order Specific Question:   Supervising Provider    Answer:   Henreitta Leber, LUTHER H [[id number]]    7. Anxiety and depression Continue lexapro has refills and xanax prn  8. Essential hypertension Has refills on hydrodiuril

## 2020-01-22 ENCOUNTER — Ambulatory Visit (INDEPENDENT_AMBULATORY_CARE_PROVIDER_SITE_OTHER): Payer: 59 | Admitting: General Surgery

## 2020-01-22 ENCOUNTER — Other Ambulatory Visit: Payer: Self-pay

## 2020-01-22 ENCOUNTER — Encounter: Payer: Self-pay | Admitting: General Surgery

## 2020-01-22 VITALS — BP 114/74 | HR 89 | Temp 98.2°F | Resp 12 | Ht 67.0 in | Wt 213.0 lb

## 2020-01-22 DIAGNOSIS — K828 Other specified diseases of gallbladder: Secondary | ICD-10-CM

## 2020-01-22 NOTE — Progress Notes (Signed)
[facility] History and Physical  Reason for Referral:  Sludge of gallbladder   Referring Physician:  Cyril Mourning, NP ([facility])   Chief Complaint    Follow-up      Alicia Gross is a 54 y.o. female.  HPI: Ms. Baumgarner is a very sweet lady who comes in with complaints of epigastric and RUQ pain for the past year or so. She had tried to change her diet and actually noticed that some of the changes seemed to make her pain worse. She also describes some bloating and nausea. She also has had some right shoulder blade pain.  She says that these episodes are random and she cannot say that any particular food causes her issues. She denies any fever or chills.  She otherwise tries to eat healthy and stay active.   Past Medical History:  Diagnosis Date  . Anxiety   . Constipation   . Elevated cholesterol   . Elevated cholesterol with high triglycerides 12/13/2016  . Elevated hemoglobin A1c 12/13/2016  . Mental disorder    anxiety  . Obesity 09/09/2014  . Rosacea   . Shingles   . Tick bite 06/2018  . Vitamin D deficiency 12/13/2016    Past Surgical History:  Procedure Laterality Date  . ABDOMINAL HYSTERECTOMY    . BUNIONECTOMY    . CERVICAL DISCECTOMY    . COLONOSCOPY    . COLONOSCOPY N/A 11/26/2015   Procedure: COLONOSCOPY;  Surgeon: Malissa Hippo, MD;  [facility]: [facility];  Service: Endoscopy;  Laterality: N/A;  1030  . LUMBAR DISC SURGERY    . SKIN BIOPSY  10/2016    Family History  Problem Relation Age of Onset  . Diabetes Mother   . Dementia Mother   . Hypertension Father   . Diabetes Father        prediabetic  . Bladder Cancer Father   . Heart disease Paternal Grandmother   . Other Sister        SVT; ablation    Social History   Tobacco Use  . Smoking status: Former Smoker    Packs/day: 0.25    Years: 5.00    Pack years: 1.25    Types: Cigarettes  . Smokeless tobacco: Never Used  Substance Use Topics  . Alcohol use: No  . Drug use:  No    Medications: I have reviewed the patient's current medications. Allergies as of 01/22/2020   No Known Allergies     Medication List       Accurate as of January 22, 2020  9:34 AM. If you have any questions, ask your nurse or doctor.        ALPRAZolam 1 MG tablet Commonly known as: XANAX TAKE 1/2 TO 1 TABLET BY MOUTH EVERY 8 HOURS AS NEEDED FOR ANXIETY.   COLACE PO Take by mouth daily.   cyclobenzaprine 10 MG tablet Commonly known as: FLEXERIL Take 10 mg by mouth as needed for muscle spasms.   diphenhydramine-acetaminophen 25-500 MG Tabs tablet Commonly known as: TYLENOL PM Take 1 tablet by mouth at bedtime as needed (sleep).   escitalopram 20 MG tablet Commonly known as: LEXAPRO TAKE ONE TABLET BY MOUTH DAILY   FIBER PO Take by mouth daily.   hydrochlorothiazide 50 MG tablet Commonly known as: HYDRODIURIL TAKE ONE TABLET (50MG )BY MOUTH DAILY   Magnesium 400 MG Caps Take by mouth.   multivitamin tablet Take 1 tablet by mouth daily.   omeprazole 40 MG capsule Commonly known as:  PRILOSEC Take 40 mg by mouth daily as needed (indigestion).   ondansetron 8 MG disintegrating tablet Commonly known as: ZOFRAN-ODT Take 8 mg by mouth every 8 (eight) hours as needed.   ondansetron 8 MG tablet Commonly known as: ZOFRAN Take 1 tablet (8 mg total) by mouth every 8 (eight) hours as needed for nausea or vomiting.   rOPINIRole 0.5 MG tablet Commonly known as: REQUIP Take 1/2 tablet at hs for 2 days then 1 tablet at hs   VITAMIN D3 PO Take 5,000 Units by mouth daily.        ROS:  A comprehensive review of systems was negative except for: Gastrointestinal: positive for abdominal pain, nausea and bloating Musculoskeletal: positive for back pain  Blood pressure 114/74, pulse 89, temperature 98.2 F (36.8 C), temperature source Oral, resp. rate 12, height 5\' 7"  (1.702 m), weight 213 lb (96.6 kg), SpO2 97 %. Physical Exam Vitals reviewed.  Constitutional:       Appearance: She is normal weight.  HENT:     Head: Normocephalic and atraumatic.     Nose: Nose normal.     Mouth/Throat:     Mouth: Mucous membranes are moist.  Eyes:     Pupils: Pupils are equal, round, and reactive to light.  Cardiovascular:     Rate and Rhythm: Normal rate and regular rhythm.  Pulmonary:     Effort: Pulmonary effort is normal.  Abdominal:     General: There is no distension.     Palpations: Abdomen is soft.     Tenderness: There is no abdominal tenderness.  Musculoskeletal:        General: Normal range of motion.     Cervical back: Normal range of motion.  Skin:    General: Skin is warm.  Neurological:     General: No focal deficit present.     Mental Status: She is alert and oriented to person, place, and time.  Psychiatric:        Mood and Affect: Mood normal.        Behavior: Behavior normal.        Thought Content: Thought content normal.        Judgment: Judgment normal.     Results: RUQ personally reviewed- layering material in the gallbladder consistent with sludge/ tiny stones  CLINICAL DATA:  Right upper quadrant abdominal pain, nausea and bloating intermittently for 1 year.  EXAM: ABDOMEN ULTRASOUND COMPLETE  COMPARISON:  03/08/2002 CT abdomen/pelvis.  FINDINGS: Gallbladder: Small amount of layering sludge within the otherwise normal gallbladder with no cholelithiasis, gallbladder wall thickening, pericholecystic fluid or sonographic Murphy's sign.  Common bile duct: Diameter: 6 mm, upper normal, with nonvisualization of distal CBD due to bowel gas.  Liver: Liver parenchyma is diffusely echogenic with posterior acoustic attenuation, compatible with diffuse hepatic steatosis. No definite liver surface irregularity. No liver masses, noting decreased sensitivity in the setting of an echogenic liver. Portal vein is patent on color Doppler imaging with normal direction of blood flow towards the liver.  IVC: No abnormality  visualized.  Pancreas: Visualized portion unremarkable.  Spleen: Size and appearance within normal limits.  Right Kidney: Length: 11.5 cm. Echogenicity within normal limits. No mass or hydronephrosis visualized.  Left Kidney: Length: 10.7 cm. Echogenicity within normal limits. No mass or hydronephrosis visualized.  Abdominal aorta: No aneurysm visualized.  Other findings: None.  IMPRESSION: 1. Diffuse hepatic steatosis. 2. Gallbladder sludge.  No cholelithiasis. 3. Top normal caliber common bile, 6 mm diameter.  Electronically Signed   By: Ilona Sorrel M.D.   On: 01/14/2020 08:36  Assessment & Plan:  Shalia Bartko Logiudice is a 54 y.o. female with gallbladder sludge/ likely tiny stones who has been having some symptoms that could likely be explained by the gallbladder especially with the right sided pain and bloating. We discussed that not all of the pain may be coming from the gallbladder but hopefully some of this will improve with removal.   -PLAN: I counseled the patient about the indication, risks and benefits of laparoscopic cholecystectomy.  She understands there is a very small chance for bleeding, infection, injury to normal structures (including common bile duct), conversion to open surgery, persistent symptoms, evolution of postcholecystectomy diarrhea, need for secondary interventions, anesthesia reaction, cardiopulmonary issues and other risks not specifically detailed here. I described the expected recovery, the plan for follow-up and the restrictions during the recovery phase.  All questions were answered.  -She wants to get her COVID vaccine and we will plan for surgery 2 weeks following the vaccine to be safe. She is going to look at her schedule and we will make a date for surgery.   All questions were answered to the satisfaction of the patient.   Virl Cagey 01/22/2020, 9:34 AM

## 2020-01-22 NOTE — Patient Instructions (Signed)
Cholelithiasis / Sludge is probably tiny gravel stones   Cholelithiasis is also called "gallstones." It is a kind of gallbladder disease. The gallbladder is an organ that stores a liquid (bile) that helps you digest fat. Gallstones may not cause symptoms (may be silent gallstones) until they cause a blockage, and then they can cause pain (gallbladder attack). Follow these instructions at home:  Take over-the-counter and prescription medicines only as told by your doctor.  Stay at a healthy weight.  Eat healthy foods. This includes: ? Eating fewer fatty foods, like fried foods. ? Eating fewer refined carbs (refined carbohydrates). Refined carbs are breads and grains that are highly processed, like white bread and white rice. Instead, choose whole grains like whole-wheat bread and brown rice. ? Eating more fiber. Almonds, fresh fruit, and beans are healthy sources of fiber.  Keep all follow-up visits as told by your doctor. This is important. Contact a doctor if:  You have sudden pain in the upper right side of your belly (abdomen). Pain might spread to your right shoulder or your chest. This may be a sign of a gallbladder attack.  You feel sick to your stomach (are nauseous).  You throw up (vomit).  You have been diagnosed with gallstones that have no symptoms and you get: ? Belly pain. ? Discomfort, burning, or fullness in the upper part of your belly (indigestion). Get help right away if:  You have sudden pain in the upper right side of your belly, and it lasts for more than 2 hours.  You have belly pain that lasts for more than 5 hours.  You have a fever or chills.  You keep feeling sick to your stomach or you keep throwing up.  Your skin or the whites of your eyes turn yellow (jaundice).  You have dark-colored pee (urine).  You have light-colored poop (stool). Summary  Cholelithiasis is also called "gallstones."  The gallbladder is an organ that stores a liquid (bile)  that helps you digest fat.  Silent gallstones are gallstones that do not cause symptoms.  A gallbladder attack may cause sudden pain in the upper right side of your belly. Pain might spread to your right shoulder or your chest. If this happens, contact your doctor.  If you have sudden pain in the upper right side of your belly that lasts for more than 2 hours, get help right away. This information is not intended to replace advice given to you by your health care provider. Make sure you discuss any questions you have with your health care provider. Document Revised: 09/30/2017 Document Reviewed: 07/04/2016 Elsevier Patient Education  [place].   Laparoscopic Cholecystectomy Laparoscopic cholecystectomy is surgery to remove the gallbladder. The gallbladder is a pear-shaped organ that lies beneath the liver on the right side of the body. The gallbladder stores bile, which is a fluid that helps the body to digest fats. Cholecystectomy is often done for inflammation of the gallbladder (cholecystitis). This condition is usually caused by a buildup of gallstones (cholelithiasis) in the gallbladder. Gallstones can block the flow of bile, which can result in inflammation and pain. In severe cases, emergency surgery may be required. This procedure is done though small incisions in your abdomen (laparoscopic surgery). A thin scope with a camera (laparoscope) is inserted through one incision. Thin surgical instruments are inserted through the other incisions. In some cases, a laparoscopic procedure may be turned into a type of surgery that is done through a larger incision (open surgery). Tell  a health care provider about:  Any allergies you have.  All medicines you are taking, including vitamins, herbs, eye drops, creams, and over-the-counter medicines.  Any problems you or family members have had with anesthetic medicines.  Any blood disorders you have.  Any surgeries you have had.  Any  medical conditions you have.  Whether you are pregnant or may be pregnant. What are the risks? Generally, this is a safe procedure. However, problems may occur, including:  Infection.  Bleeding.  Allergic reactions to medicines.  Damage to other structures or organs.  A stone remaining in the common bile duct. The common bile duct carries bile from the gallbladder into the small intestine.  A bile leak from the cyst duct that is clipped when your gallbladder is removed. Medicines  Ask your health care provider about: ? Changing or stopping your regular medicines. This is especially important if you are taking diabetes medicines or blood thinners. ? Taking medicines such as aspirin and ibuprofen. These medicines can thin your blood. Do not take these medicines before your procedure if your health care provider instructs you not to.  You may be given antibiotic medicine to help prevent infection. General instructions  Let your health care provider know if you develop a cold or an infection before surgery.  Plan to have someone take you home from the hospital or clinic.  Ask your health care provider how your surgical site will be marked or identified. What happens during the procedure?   To reduce your risk of infection: ? Your health care team will wash or sanitize their hands. ? Your skin will be washed with soap. ? Hair may be removed from the surgical area.  An IV tube may be inserted into one of your veins.  You will be given one or more of the following: ? A medicine to help you relax (sedative). ? A medicine to make you fall asleep (general anesthetic).  A breathing tube will be placed in your mouth.  Your surgeon will make several small cuts (incisions) in your abdomen.  The laparoscope will be inserted through one of the small incisions. The camera on the laparoscope will send images to a TV screen (monitor) in the operating room. This lets your surgeon see  inside your abdomen.  Air-like gas will be pumped into your abdomen. This will expand your abdomen to give the surgeon more room to perform the surgery.  Other tools that are needed for the procedure will be inserted through the other incisions. The gallbladder will be removed through one of the incisions.  Your common bile duct may be examined. If stones are found in the common bile duct, they may be removed.  After your gallbladder has been removed, the incisions will be closed with stitches (sutures), staples, or skin glue.  Your incisions may be covered with a bandage (dressing). The procedure may vary among health care providers and hospitals. What happens after the procedure?  Your blood pressure, heart rate, breathing rate, and blood oxygen level will be monitored until the medicines you were given have worn off.  You will be given medicines as needed to control your pain.  Do not drive for 24 hours if you were given a sedative. This information is not intended to replace advice given to you by your health care provider. Make sure you discuss any questions you have with your health care provider. Document Revised: 09/30/2017 Document Reviewed: 04/05/2016 Elsevier Patient Education  2020 [employer].

## 2020-02-07 NOTE — H&P (Signed)
[facility] History and Physical  Reason for Referral: Sludge of gallbladder  Referring Physician: Cyril Mourning, NP ([facility])     Chief Complaint     Follow-up      Alicia Gross is a 54 y.o. female.  HPI: Alicia Gross is a very sweet lady who comes in with complaints of epigastric and RUQ pain for the past year or so. She had tried to change her diet and actually noticed that some of the changes seemed to make her pain worse. She also describes some bloating and nausea. She also has had some right shoulder blade pain. She says that these episodes are random and she cannot say that any particular food causes her issues. She denies any fever or chills. She otherwise tries to eat healthy and stay active.      Past Medical History:  Diagnosis Date  . Anxiety   . Constipation   . Elevated cholesterol   . Elevated cholesterol with high triglycerides 12/13/2016  . Elevated hemoglobin A1c 12/13/2016  . Mental disorder    anxiety  . Obesity 09/09/2014  . Rosacea   . Shingles   . Tick bite 06/2018  . Vitamin D deficiency 12/13/2016        Past Surgical History:  Procedure Laterality Date  . ABDOMINAL HYSTERECTOMY    . BUNIONECTOMY    . CERVICAL DISCECTOMY    . COLONOSCOPY    . COLONOSCOPY N/A 11/26/2015   Procedure: COLONOSCOPY; Surgeon: Malissa Hippo, MD; Location: [facility]; Service: Endoscopy; Laterality: N/A; 1030  . LUMBAR DISC SURGERY    . SKIN BIOPSY  10/2016        Family History  Problem Relation Age of Onset  . Diabetes Mother   . Dementia Mother   . Hypertension Father   . Diabetes Father    prediabetic  . Bladder Cancer Father   . Heart disease Paternal Grandmother   . Other Sister    SVT; ablation   Social History        Tobacco Use  . Smoking status: Former Smoker    Packs/day: 0.25    Years: 5.00    Pack years: 1.25    Types: Cigarettes  . Smokeless tobacco: Never Used  Substance Use Topics  . Alcohol use: No  . Drug use:  No   Medications: I have reviewed the patient's current medications.  Allergies as of 01/22/2020   No Known Allergies          Medication List       Accurate as of January 22, 2020 9:34 AM. If you have any questions, ask your nurse or doctor.        ALPRAZolam 1 MG tablet  Commonly known as: XANAX  TAKE 1/2 TO 1 TABLET BY MOUTH EVERY 8 HOURS AS NEEDED FOR ANXIETY.   COLACE PO  Take by mouth daily.   cyclobenzaprine 10 MG tablet  Commonly known as: FLEXERIL  Take 10 mg by mouth as needed for muscle spasms.   diphenhydramine-acetaminophen 25-500 MG Tabs tablet  Commonly known as: TYLENOL PM  Take 1 tablet by mouth at bedtime as needed (sleep).   escitalopram 20 MG tablet  Commonly known as: LEXAPRO  TAKE ONE TABLET BY MOUTH DAILY   FIBER PO  Take by mouth daily.   hydrochlorothiazide 50 MG tablet  Commonly known as: HYDRODIURIL  TAKE ONE TABLET (50MG )BY MOUTH DAILY   Magnesium 400 MG Caps  Take by mouth.  multivitamin tablet  Take 1 tablet by mouth daily.   omeprazole 40 MG capsule  Commonly known as: PRILOSEC  Take 40 mg by mouth daily as needed (indigestion).   ondansetron 8 MG disintegrating tablet  Commonly known as: ZOFRAN-ODT  Take 8 mg by mouth every 8 (eight) hours as needed.   ondansetron 8 MG tablet  Commonly known as: ZOFRAN  Take 1 tablet (8 mg total) by mouth every 8 (eight) hours as needed for nausea or vomiting.   rOPINIRole 0.5 MG tablet  Commonly known as: REQUIP  Take 1/2 tablet at hs for 2 days then 1 tablet at hs   VITAMIN D3 PO  Take 5,000 Units by mouth daily.       ROS:  A comprehensive review of systems was negative except for: Gastrointestinal: positive for abdominal pain, nausea and bloating  Musculoskeletal: positive for back pain  Blood pressure 114/74, pulse 89, temperature 98.2 F (36.8 C), temperature source Oral, resp. rate 12, height 5\' 7"  (1.702 m), weight 213 lb (96.6 kg), SpO2 97 %.  Physical Exam  Vitals reviewed.    Constitutional:  Appearance: She is normal weight.  HENT:  Head: Normocephalic and atraumatic.  Nose: Nose normal.  Mouth/Throat:  Mouth: Mucous membranes are moist.  Eyes:  Pupils: Pupils are equal, round, and reactive to light.  Cardiovascular:  Rate and Rhythm: Normal rate and regular rhythm.  Pulmonary:  Effort: Pulmonary effort is normal.  Abdominal:  General: There is no distension.  Palpations: Abdomen is soft.  Tenderness: There is no abdominal tenderness.  Musculoskeletal:  General: Normal range of motion.  Cervical back: Normal range of motion.  Skin:  General: Skin is warm.  Neurological:  General: No focal deficit present.  Mental Status: She is alert and oriented to person, place, and time.  Psychiatric:  Mood and Affect: Mood normal.  Behavior: Behavior normal.  Thought Content: Thought content normal.  Judgment: Judgment normal.   Results:  RUQ personally reviewed- layering material in the gallbladder consistent with sludge/ tiny stones  CLINICAL DATA: Right upper quadrant abdominal pain, nausea and  bloating intermittently for 1 year.  EXAM:  ABDOMEN ULTRASOUND COMPLETE  COMPARISON: 03/08/2002 CT abdomen/pelvis.  FINDINGS:  Gallbladder: Small amount of layering sludge within the otherwise  normal gallbladder with no cholelithiasis, gallbladder wall  thickening, pericholecystic fluid or sonographic Murphy's sign.  Common bile duct: Diameter: 6 mm, upper normal, with  nonvisualization of distal CBD due to bowel gas.  Liver: Liver parenchyma is diffusely echogenic with posterior  acoustic attenuation, compatible with diffuse hepatic steatosis. No  definite liver surface irregularity. No liver masses, noting  decreased sensitivity in the setting of an echogenic liver. Portal  vein is patent on color Doppler imaging with normal direction of  blood flow towards the liver.  IVC: No abnormality visualized.  Pancreas: Visualized portion unremarkable.   Spleen: Size and appearance within normal limits.  Right Kidney: Length: 11.5 cm. Echogenicity within normal limits. No  mass or hydronephrosis visualized.  Left Kidney: Length: 10.7 cm. Echogenicity within normal limits. No  mass or hydronephrosis visualized.  Abdominal aorta: No aneurysm visualized.  Other findings: None.  IMPRESSION:  1. Diffuse hepatic steatosis.  2. Gallbladder sludge. No cholelithiasis.  3. Top normal caliber common bile, 6 mm diameter.  Electronically Signed  By: 05/08/2002 M.D.  On: 01/14/2020 08:36   Assessment & Plan:  Amairani Shuey Skiver is a 54 y.o. female with gallbladder sludge/ likely tiny stones who has  been having some symptoms that could likely be explained by the gallbladder especially with the right sided pain and bloating. We discussed that not all of the pain may be coming from the gallbladder but hopefully some of this will improve with removal.  -PLAN: I counseled the patient about the indication, risks and benefits of laparoscopic cholecystectomy. She understands there is a very small chance for bleeding, infection, injury to normal structures (including common bile duct), conversion to open surgery, persistent symptoms, evolution of postcholecystectomy diarrhea, need for secondary interventions, anesthesia reaction, cardiopulmonary issues and other risks not specifically detailed here. I described the expected recovery, the plan for follow-up and the restrictions during the recovery phase. All questions were answered.  -She wants to get her COVID vaccine and we will plan for surgery 2 weeks following the vaccine to be safe. She is going to look at her schedule and we will make a date for surgery.  All questions were answered to the satisfaction of the patient.  Virl Cagey  01/22/2020, 9:34 AM

## 2020-02-12 NOTE — Patient Instructions (Signed)
Alicia Gross  02/12/2020     @PREFPERIOPPHARMACY @   Your procedure is scheduled on  02/15/2020 .  Report to 02/17/2020 at  0800  A.M.  Call this number if you have problems the morning of surgery:  [phone]   Remember:  Do not eat or drink after midnight.                         Take these medicines the morning of surgery with A SIP OF WATER  Xanax(if needed), flexeril, lexapro, zofran(if needed).    Do not wear jewelry, make-up or nail polish.  Do not wear lotions, powders, or perfumes. Please wear deodorant and brush your teeth.  Do not shave 48 hours prior to surgery.  Men may shave face and neck.  Do not bring valuables to the hospital.  [facility] is not responsible for any belongings or valuables.  Contacts, dentures or bridgework may not be worn into surgery.  Leave your suitcase in the car.  After surgery it may be brought to your room.  For patients admitted to the hospital, discharge time will be determined by your treatment team.  Patients discharged the day of surgery will not be allowed to drive home.   Name and phone number of your driver:   family Special instructions:  DO NOT smoke the morning of your procedure.  Please read over the following fact sheets that you were given. Anesthesia Post-op Instructions and Care and Recovery After Surgery       Laparoscopic Cholecystectomy, Care After This sheet gives you information about how to care for yourself after your procedure. Your health care provider may also give you more specific instructions. If you have problems or questions, contact your health care provider. What can I expect after the procedure? After the procedure, it is common to have:  Pain at your incision sites. You will be given medicines to control this pain.  Mild nausea or vomiting.  Bloating and possible shoulder pain from the air-like gas that was used during the procedure. Follow these instructions at  home: Incision care   Follow instructions from your health care provider about how to take care of your incisions. Make sure you: ? Wash your hands with soap and water before you change your bandage (dressing). If soap and water are not available, use hand sanitizer. ? Change your dressing as told by your health care provider. ? Leave stitches (sutures), skin glue, or adhesive strips in place. These skin closures may need to be in place for 2 weeks or longer. If adhesive strip edges start to loosen and curl up, you may trim the loose edges. Do not remove adhesive strips completely unless your health care provider tells you to do that.  Do not take baths, swim, or use a hot tub until your health care provider approves. Ask your health care provider if you can take showers. You may only be allowed to take sponge baths for bathing.  Check your incision area every day for signs of infection. Check for: ? More redness, swelling, or pain. ? More fluid or blood. ? Warmth. ? Pus or a bad smell. Activity  Do not drive or use heavy machinery while taking prescription pain medicine.  Do not lift anything that is heavier than 10 lb (4.5 kg) until your health care provider approves.  Do not play contact sports until your health care provider  approves.  Do not drive for 24 hours if you were given a medicine to help you relax (sedative).  Rest as needed. Do not return to work or school until your health care provider approves. General instructions  Take over-the-counter and prescription medicines only as told by your health care provider.  To prevent or treat constipation while you are taking prescription pain medicine, your health care provider may recommend that you: ? Drink enough fluid to keep your urine clear or pale yellow. ? Take over-the-counter or prescription medicines. ? Eat foods that are high in fiber, such as fresh fruits and vegetables, whole grains, and beans. ? Limit foods that  are high in fat and processed sugars, such as fried and sweet foods. Contact a health care provider if:  You develop a rash.  You have more redness, swelling, or pain around your incisions.  You have more fluid or blood coming from your incisions.  Your incisions feel warm to the touch.  You have pus or a bad smell coming from your incisions.  You have a fever.  One or more of your incisions breaks open. Get help right away if:  You have trouble breathing.  You have chest pain.  You have increasing pain in your shoulders.  You faint or feel dizzy when you stand.  You have severe pain in your abdomen.  You have nausea or vomiting that lasts for more than one day.  You have leg pain. This information is not intended to replace advice given to you by your health care provider. Make sure you discuss any questions you have with your health care provider. Document Revised: 09/30/2017 Document Reviewed: 04/05/2016 Elsevier Patient Education  2020 [employer]  General Anesthesia, Adult, Care After This sheet gives you information about how to care for yourself after your procedure. Your health care provider may also give you more specific instructions. If you have problems or questions, contact your health care provider. What can I expect after the procedure? After the procedure, the following side effects are common:  Pain or discomfort at the IV site.  Nausea.  Vomiting.  Sore throat.  Trouble concentrating.  Feeling cold or chills.  Weak or tired.  Sleepiness and fatigue.  Soreness and body aches. These side effects can affect parts of the body that were not involved in surgery. Follow these instructions at home:  For at least 24 hours after the procedure:  Have a responsible adult stay with you. It is important to have someone help care for you until you are awake and alert.  Rest as needed.  Do not: ? Participate in activities in which you could fall  or become injured. ? Drive. ? Use heavy machinery. ? Drink alcohol. ? Take sleeping pills or medicines that cause drowsiness. ? Make important decisions or sign legal documents. ? Take care of children on your own. Eating and drinking  Follow any instructions from your health care provider about eating or drinking restrictions.  When you feel hungry, start by eating small amounts of foods that are soft and easy to digest (bland), such as toast. Gradually return to your regular diet.  Drink enough fluid to keep your urine pale yellow.  If you vomit, rehydrate by drinking water, juice, or clear broth. General instructions  If you have sleep apnea, surgery and certain medicines can increase your risk for breathing problems. Follow instructions from your health care provider about wearing your sleep device: ? Anytime you are sleeping, including  during daytime naps. ? While taking prescription pain medicines, sleeping medicines, or medicines that make you drowsy.  Return to your normal activities as told by your health care provider. Ask your health care provider what activities are safe for you.  Take over-the-counter and prescription medicines only as told by your health care provider.  If you smoke, do not smoke without supervision.  Keep all follow-up visits as told by your health care provider. This is important. Contact a health care provider if:  You have nausea or vomiting that does not get better with medicine.  You cannot eat or drink without vomiting.  You have pain that does not get better with medicine.  You are unable to pass urine.  You develop a skin rash.  You have a fever.  You have redness around your IV site that gets worse. Get help right away if:  You have difficulty breathing.  You have chest pain.  You have blood in your urine or stool, or you vomit blood. Summary  After the procedure, it is common to have a sore throat or nausea. It is also  common to feel tired.  Have a responsible adult stay with you for the first 24 hours after general anesthesia. It is important to have someone help care for you until you are awake and alert.  When you feel hungry, start by eating small amounts of foods that are soft and easy to digest (bland), such as toast. Gradually return to your regular diet.  Drink enough fluid to keep your urine pale yellow.  Return to your normal activities as told by your health care provider. Ask your health care provider what activities are safe for you. This information is not intended to replace advice given to you by your health care provider. Make sure you discuss any questions you have with your health care provider. Document Revised: 10/21/2017 Document Reviewed: 06/03/2017 Elsevier Patient Education  [place]. How to Use Chlorhexidine for Bathing Chlorhexidine gluconate (CHG) is a germ-killing (antiseptic) solution that is used to clean the skin. It can get rid of the bacteria that normally live on the skin and can keep them away for about 24 hours. To clean your skin with CHG, you may be given:  A CHG solution to use in the shower or as part of a sponge bath.  A prepackaged cloth that contains CHG. Cleaning your skin with CHG may help lower the risk for infection:  While you are staying in the intensive care unit of the hospital.  If you have a vascular access, such as a central line, to provide short-term or long-term access to your veins.  If you have a catheter to drain urine from your bladder.  If you are on a ventilator. A ventilator is a machine that helps you breathe by moving air in and out of your lungs.  After surgery. What are the risks? Risks of using CHG include:  A skin reaction.  Hearing loss, if CHG gets in your ears.  Eye injury, if CHG gets in your eyes and is not rinsed out.  The CHG product catching fire. Make sure that you avoid smoking and flames after applying  CHG to your skin. Do not use CHG:  If you have a chlorhexidine allergy or have previously reacted to chlorhexidine.  On babies younger than 85 months of age. How to use CHG solution  Use CHG only as told by your health care provider, and follow the instructions on  the label.  Use the full amount of CHG as directed. Usually, this is one bottle. During a shower Follow these steps when using CHG solution during a shower (unless your health care provider gives you different instructions): 1. Start the shower. 2. Use your normal soap and shampoo to wash your face and hair. 3. Turn off the shower or move out of the shower stream. 4. Pour the CHG onto a clean washcloth. Do not use any type of brush or rough-edged sponge. 5. Starting at your neck, lather your body down to your toes. Make sure you follow these instructions: ? If you will be having surgery, pay special attention to the part of your body where you will be having surgery. Scrub this area for at least 1 minute. ? Do not use CHG on your head or face. If the solution gets into your ears or eyes, rinse them well with water. ? Avoid your genital area. ? Avoid any areas of skin that have broken skin, cuts, or scrapes. ? Scrub your back and under your arms. Make sure to wash skin folds. 6. Let the lather sit on your skin for 1-2 minutes or as long as told by your health care provider. 7. Thoroughly rinse your entire body in the shower. Make sure that all body creases and crevices are rinsed well. 8. Dry off with a clean towel. Do not put any substances on your body afterward--such as powder, lotion, or perfume--unless you are told to do so by your health care provider. Only use lotions that are recommended by the manufacturer. 9. Put on clean clothes or pajamas. 10. If it is the night before your surgery, sleep in clean sheets.  During a sponge bath Follow these steps when using CHG solution during a sponge bath (unless your health care  provider gives you different instructions): 1. Use your normal soap and shampoo to wash your face and hair. 2. Pour the CHG onto a clean washcloth. 3. Starting at your neck, lather your body down to your toes. Make sure you follow these instructions: ? If you will be having surgery, pay special attention to the part of your body where you will be having surgery. Scrub this area for at least 1 minute. ? Do not use CHG on your head or face. If the solution gets into your ears or eyes, rinse them well with water. ? Avoid your genital area. ? Avoid any areas of skin that have broken skin, cuts, or scrapes. ? Scrub your back and under your arms. Make sure to wash skin folds. 4. Let the lather sit on your skin for 1-2 minutes or as long as told by your health care provider. 5. Using a different clean, wet washcloth, thoroughly rinse your entire body. Make sure that all body creases and crevices are rinsed well. 6. Dry off with a clean towel. Do not put any substances on your body afterward--such as powder, lotion, or perfume--unless you are told to do so by your health care provider. Only use lotions that are recommended by the manufacturer. 7. Put on clean clothes or pajamas. 8. If it is the night before your surgery, sleep in clean sheets. How to use CHG prepackaged cloths  Only use CHG cloths as told by your health care provider, and follow the instructions on the label.  Use the CHG cloth on clean, dry skin.  Do not use the CHG cloth on your head or face unless your health care provider tells you  to.  When washing with the CHG cloth: ? Avoid your genital area. ? Avoid any areas of skin that have broken skin, cuts, or scrapes. Before surgery Follow these steps when using a CHG cloth to clean before surgery (unless your health care provider gives you different instructions): 1. Using the CHG cloth, vigorously scrub the part of your body where you will be having surgery. Scrub using a  back-and-forth motion for 3 minutes. The area on your body should be completely wet with CHG when you are done scrubbing. 2. Do not rinse. Discard the cloth and let the area air-dry. Do not put any substances on the area afterward, such as powder, lotion, or perfume. 3. Put on clean clothes or pajamas. 4. If it is the night before your surgery, sleep in clean sheets.  For general bathing Follow these steps when using CHG cloths for general bathing (unless your health care provider gives you different instructions). 1. Use a separate CHG cloth for each area of your body. Make sure you wash between any folds of skin and between your fingers and toes. Wash your body in the following order, switching to a new cloth after each step: ? The front of your neck, shoulders, and chest. ? Both of your arms, under your arms, and your hands. ? Your stomach and groin area, avoiding the genitals. ? Your right leg and foot. ? Your left leg and foot. ? The back of your neck, your back, and your buttocks. 2. Do not rinse. Discard the cloth and let the area air-dry. Do not put any substances on your body afterward--such as powder, lotion, or perfume--unless you are told to do so by your health care provider. Only use lotions that are recommended by the manufacturer. 3. Put on clean clothes or pajamas. Contact a health care provider if:  Your skin gets irritated after scrubbing.  You have questions about using your solution or cloth. Get help right away if:  Your eyes become very red or swollen.  Your eyes itch badly.  Your skin itches badly and is red or swollen.  Your hearing changes.  You have trouble seeing.  You have swelling or tingling in your mouth or throat.  You have trouble breathing.  You swallow any chlorhexidine. Summary  Chlorhexidine gluconate (CHG) is a germ-killing (antiseptic) solution that is used to clean the skin. Cleaning your skin with CHG may help to lower your risk for  infection.  You may be given CHG to use for bathing. It may be in a bottle or in a prepackaged cloth to use on your skin. Carefully follow your health care provider's instructions and the instructions on the product label.  Do not use CHG if you have a chlorhexidine allergy.  Contact your health care provider if your skin gets irritated after scrubbing. This information is not intended to replace advice given to you by your health care provider. Make sure you discuss any questions you have with your health care provider. Document Revised: 01/04/2019 Document Reviewed: 09/15/2017 Elsevier Patient Education  2020 [employer].

## 2020-02-13 ENCOUNTER — Encounter (HOSPITAL_COMMUNITY)
Admission: RE | Admit: 2020-02-13 | Discharge: 2020-02-13 | Disposition: A | Discharge status: Home / Self Care | Payer: 59 | Source: Ambulatory Visit | Attending: General Surgery | Admitting: General Surgery

## 2020-02-13 ENCOUNTER — Other Ambulatory Visit (HOSPITAL_COMMUNITY)
Admission: RE | Admit: 2020-02-13 | Discharge: 2020-02-13 | Disposition: A | Discharge status: Home / Self Care | Payer: 59 | Source: Ambulatory Visit | Attending: General Surgery | Admitting: General Surgery

## 2020-02-13 ENCOUNTER — Other Ambulatory Visit: Payer: Self-pay

## 2020-02-13 ENCOUNTER — Encounter (HOSPITAL_COMMUNITY): Payer: Self-pay

## 2020-02-13 DIAGNOSIS — Z20822 Contact with and (suspected) exposure to covid-19: Secondary | ICD-10-CM | POA: Diagnosis not present

## 2020-02-13 DIAGNOSIS — R9431 Abnormal electrocardiogram [ECG] [EKG]: Secondary | ICD-10-CM | POA: Insufficient documentation

## 2020-02-13 DIAGNOSIS — Z01818 Encounter for other preprocedural examination: Secondary | ICD-10-CM | POA: Diagnosis not present

## 2020-02-13 HISTORY — DX: Essential (primary) hypertension: I10

## 2020-02-13 HISTORY — DX: Other specified postprocedural states: Z98.890

## 2020-02-13 HISTORY — DX: Nausea with vomiting, unspecified: R11.2

## 2020-02-13 HISTORY — DX: Localized edema: R60.0

## 2020-02-13 LAB — BASIC METABOLIC PANEL
Anion gap: 10 (ref 5–15)
BUN: 16 mg/dL (ref 6–20)
CO2: 28 mmol/L (ref 22–32)
Calcium: 9.3 mg/dL (ref 8.9–10.3)
Chloride: 99 mmol/L (ref 98–111)
Creatinine, Ser: 0.66 mg/dL (ref 0.44–1.00)
GFR calc Af Amer: >60 mL/min (ref >60)
GFR calc non Af Amer: >60 mL/min (ref >60)
Glucose, Bld: 98 mg/dL (ref 70–99)
Potassium: 3.7 mmol/L (ref 3.5–5.1)
Sodium: 137 mmol/L (ref 135–145)

## 2020-02-13 LAB — HEMOGLOBIN A1C
Hgb A1c MFr Bld: 5.6 % (ref 4.8–5.6)
Mean Plasma Glucose: 114.02 mg/dL

## 2020-02-14 LAB — SARS CORONAVIRUS 2 (TAT 6-24 HRS): SARS Coronavirus 2: NEGATIVE

## 2020-02-15 ENCOUNTER — Encounter (HOSPITAL_COMMUNITY)
Admission: RE | Disposition: A | Discharge status: Home / Self Care | Payer: Self-pay | Source: Home / Self Care | Attending: General Surgery

## 2020-02-15 ENCOUNTER — Encounter (HOSPITAL_COMMUNITY): Payer: Self-pay | Admitting: General Surgery

## 2020-02-15 ENCOUNTER — Ambulatory Visit (HOSPITAL_COMMUNITY): Payer: 59 | Admitting: Anesthesiology

## 2020-02-15 ENCOUNTER — Other Ambulatory Visit: Payer: Self-pay | Admitting: Adult Health

## 2020-02-15 ENCOUNTER — Ambulatory Visit (HOSPITAL_COMMUNITY)
Admission: RE | Admit: 2020-02-15 | Discharge: 2020-02-15 | Disposition: A | Discharge status: Home / Self Care | Payer: 59 | Attending: General Surgery | Admitting: General Surgery

## 2020-02-15 DIAGNOSIS — F419 Anxiety disorder, unspecified: Secondary | ICD-10-CM | POA: Diagnosis not present

## 2020-02-15 DIAGNOSIS — K811 Chronic cholecystitis: Secondary | ICD-10-CM | POA: Diagnosis not present

## 2020-02-15 DIAGNOSIS — Z6832 Body mass index (BMI) 32.0-32.9, adult: Secondary | ICD-10-CM | POA: Diagnosis not present

## 2020-02-15 DIAGNOSIS — E559 Vitamin D deficiency, unspecified: Secondary | ICD-10-CM | POA: Diagnosis not present

## 2020-02-15 DIAGNOSIS — K828 Other specified diseases of gallbladder: Secondary | ICD-10-CM | POA: Diagnosis not present

## 2020-02-15 DIAGNOSIS — G2581 Restless legs syndrome: Secondary | ICD-10-CM | POA: Insufficient documentation

## 2020-02-15 DIAGNOSIS — Z87891 Personal history of nicotine dependence: Secondary | ICD-10-CM | POA: Insufficient documentation

## 2020-02-15 DIAGNOSIS — I10 Essential (primary) hypertension: Secondary | ICD-10-CM | POA: Diagnosis not present

## 2020-02-15 DIAGNOSIS — E669 Obesity, unspecified: Secondary | ICD-10-CM | POA: Diagnosis not present

## 2020-02-15 DIAGNOSIS — F329 Major depressive disorder, single episode, unspecified: Secondary | ICD-10-CM | POA: Insufficient documentation

## 2020-02-15 HISTORY — PX: CHOLECYSTECTOMY: SHX55

## 2020-02-15 SURGERY — LAPAROSCOPIC CHOLECYSTECTOMY
Anesthesia: General

## 2020-02-15 MED ORDER — DEXAMETHASONE SODIUM PHOSPHATE 10 MG/ML IJ SOLN
INTRAMUSCULAR | Status: DC | PRN
  Administered 2020-02-15: 10 mg via INTRAVENOUS

## 2020-02-15 MED ORDER — CHLORHEXIDINE GLUCONATE CLOTH 2 % EX PADS: 6.0000 | MEDICATED_PAD | Freq: Once | CUTANEOUS | Status: DC

## 2020-02-15 MED ORDER — HYDROMORPHONE HCL 1 MG/ML IJ SOLN
0.2500 mg | INTRAMUSCULAR | Status: DC | PRN
  Administered 2020-02-15: 0.5 mg via INTRAVENOUS
  Filled 2020-02-15: qty 0.5

## 2020-02-15 MED ORDER — ARTIFICIAL TEARS OPHTHALMIC OINT
TOPICAL_OINTMENT | OPHTHALMIC | Status: AC
  Filled 2020-02-15: qty 3.5

## 2020-02-15 MED ORDER — PROMETHAZINE HCL 25 MG/ML IJ SOLN
6.2500 mg | INTRAMUSCULAR | Status: DC | PRN
  Administered 2020-02-15: 12.5 mg via INTRAVENOUS
  Filled 2020-02-15: qty 1

## 2020-02-15 MED ORDER — SODIUM CHLORIDE 0.9 % IV SOLN
2.0000 g | INTRAVENOUS | Status: AC
  Administered 2020-02-15: 2 g via INTRAVENOUS

## 2020-02-15 MED ORDER — ONDANSETRON HCL 4 MG/2ML IJ SOLN
INTRAMUSCULAR | Status: AC
  Filled 2020-02-15: qty 2

## 2020-02-15 MED ORDER — SCOPOLAMINE 1 MG/3DAYS TD PT72
MEDICATED_PATCH | TRANSDERMAL | Status: AC
  Filled 2020-02-15: qty 1

## 2020-02-15 MED ORDER — DOCUSATE SODIUM 100 MG PO CAPS: 100.0000 mg | ORAL_CAPSULE | Freq: Two times a day (BID) | ORAL | 2 refills | Status: AC

## 2020-02-15 MED ORDER — SODIUM CHLORIDE FLUSH 0.9 % IV SOLN
INTRAVENOUS | Status: AC
  Filled 2020-02-15: qty 10

## 2020-02-15 MED ORDER — FENTANYL CITRATE (PF) 100 MCG/2ML IJ SOLN
INTRAMUSCULAR | Status: DC | PRN
  Administered 2020-02-15: 50 ug via INTRAVENOUS
  Administered 2020-02-15: 100 ug via INTRAVENOUS
  Administered 2020-02-15: 50 ug via INTRAVENOUS

## 2020-02-15 MED ORDER — DEXAMETHASONE SODIUM PHOSPHATE 10 MG/ML IJ SOLN
INTRAMUSCULAR | Status: AC
  Filled 2020-02-15: qty 1

## 2020-02-15 MED ORDER — OXYCODONE HCL 5 MG PO TABS: 5.0000 mg | ORAL_TABLET | ORAL | 0 refills | Status: AC | PRN

## 2020-02-15 MED ORDER — BUPIVACAINE HCL (PF) 0.5 % IJ SOLN
INTRAMUSCULAR | Status: DC | PRN
  Administered 2020-02-15: 10 mL

## 2020-02-15 MED ORDER — MIDAZOLAM HCL 2 MG/2ML IJ SOLN
2.0000 mg | Freq: Once | INTRAMUSCULAR | Status: AC
  Administered 2020-02-15: 09:00:00 2 mg via INTRAVENOUS

## 2020-02-15 MED ORDER — ROCURONIUM BROMIDE 10 MG/ML (PF) SYRINGE
PREFILLED_SYRINGE | INTRAVENOUS | Status: AC
  Filled 2020-02-15: qty 10

## 2020-02-15 MED ORDER — SODIUM CHLORIDE 0.9 % IR SOLN
Status: DC | PRN
  Administered 2020-02-15: 1000 mL

## 2020-02-15 MED ORDER — SUGAMMADEX SODIUM 500 MG/5ML IV SOLN
INTRAVENOUS | Status: DC | PRN
  Administered 2020-02-15: 200 mg via INTRAVENOUS

## 2020-02-15 MED ORDER — LACTATED RINGERS IV SOLN: Freq: Once | INTRAVENOUS | Status: AC

## 2020-02-15 MED ORDER — HEMOSTATIC AGENTS (NO CHARGE) OPTIME
TOPICAL | Status: DC | PRN
  Administered 2020-02-15: 1 via TOPICAL

## 2020-02-15 MED ORDER — ROCURONIUM BROMIDE 100 MG/10ML IV SOLN
INTRAVENOUS | Status: DC | PRN
  Administered 2020-02-15: 30 mg via INTRAVENOUS

## 2020-02-15 MED ORDER — PROPOFOL 10 MG/ML IV BOLUS
INTRAVENOUS | Status: AC
  Filled 2020-02-15: qty 20

## 2020-02-15 MED ORDER — SODIUM CHLORIDE 0.9 % IV SOLN
INTRAVENOUS | Status: AC
  Filled 2020-02-15: qty 2

## 2020-02-15 MED ORDER — ONDANSETRON HCL 4 MG/2ML IJ SOLN
INTRAMUSCULAR | Status: DC | PRN
  Administered 2020-02-15: 4 mg via INTRAVENOUS

## 2020-02-15 MED ORDER — ONDANSETRON HCL 8 MG PO TABS: 8.0000 mg | ORAL_TABLET | Freq: Three times a day (TID) | ORAL | 1 refills | Status: DC | PRN

## 2020-02-15 MED ORDER — MEPERIDINE HCL 50 MG/ML IJ SOLN
6.2500 mg | INTRAMUSCULAR | Status: DC | PRN
  Administered 2020-02-15: 12.5 mg via INTRAVENOUS
  Filled 2020-02-15: qty 1

## 2020-02-15 MED ORDER — FENTANYL CITRATE (PF) 250 MCG/5ML IJ SOLN
INTRAMUSCULAR | Status: AC
  Filled 2020-02-15: qty 5

## 2020-02-15 MED ORDER — SCOPOLAMINE 1 MG/3DAYS TD PT72
1.0000 | MEDICATED_PATCH | Freq: Once | TRANSDERMAL | Status: DC
  Administered 2020-02-15: 1.5 mg via TRANSDERMAL

## 2020-02-15 MED ORDER — LACTATED RINGERS IV SOLN: INTRAVENOUS | Status: DC | PRN

## 2020-02-15 MED ORDER — SUCCINYLCHOLINE CHLORIDE 200 MG/10ML IV SOSY
PREFILLED_SYRINGE | INTRAVENOUS | Status: AC
  Filled 2020-02-15: qty 10

## 2020-02-15 MED ORDER — PROPOFOL 10 MG/ML IV BOLUS
INTRAVENOUS | Status: DC | PRN
  Administered 2020-02-15: 20 mg via INTRAVENOUS
  Administered 2020-02-15: 150 mg via INTRAVENOUS
  Administered 2020-02-15: 30 mg via INTRAVENOUS

## 2020-02-15 MED ORDER — MIDAZOLAM HCL 2 MG/2ML IJ SOLN
INTRAMUSCULAR | Status: AC
  Filled 2020-02-15: qty 2

## 2020-02-15 SURGICAL SUPPLY — 41 items
APPLIER CLIP ROT 10 11.4 M/L (STAPLE) ×2
BAG RETRIEVAL 10 (BASKET) ×1
BLADE SURG 15 STRL LF DISP TIS (BLADE) ×1 IMPLANT
BLADE SURG 15 STRL SS (BLADE) ×2
CHLORAPREP W/TINT 26 (MISCELLANEOUS) ×2 IMPLANT
CLIP APPLIE ROT 10 11.4 M/L (STAPLE) ×1 IMPLANT
CLOTH BEACON ORANGE TIMEOUT ST (SAFETY) ×2 IMPLANT
COVER LIGHT HANDLE STERIS (MISCELLANEOUS) ×4 IMPLANT
COVER WAND RF STERILE (DRAPES) ×2 IMPLANT
DECANTER SPIKE VIAL GLASS SM (MISCELLANEOUS) ×2 IMPLANT
DERMABOND ADVANCED (GAUZE/BANDAGES/DRESSINGS) ×1
DERMABOND ADVANCED .7 DNX12 (GAUZE/BANDAGES/DRESSINGS) ×1 IMPLANT
ELECT REM PT RETURN 9FT ADLT (ELECTROSURGICAL) ×2
ELECTRODE REM PT RTRN 9FT ADLT (ELECTROSURGICAL) ×1 IMPLANT
GLOVE BIO SURGEON STRL SZ 6.5 (GLOVE) ×2 IMPLANT
GLOVE BIOGEL PI IND STRL 6.5 (GLOVE) ×1 IMPLANT
GLOVE BIOGEL PI IND STRL 7.0 (GLOVE) ×2 IMPLANT
GLOVE BIOGEL PI INDICATOR 6.5 (GLOVE) ×1
GLOVE BIOGEL PI INDICATOR 7.0 (GLOVE) ×2
GLOVE ECLIPSE 6.5 STRL STRAW (GLOVE) ×4 IMPLANT
GOWN STRL REUS W/TWL LRG LVL3 (GOWN DISPOSABLE) ×6 IMPLANT
HEMOSTAT SNOW SURGICEL 2X4 (HEMOSTASIS) ×2 IMPLANT
INST SET LAPROSCOPIC AP (KITS) ×2 IMPLANT
KIT TURNOVER KIT A (KITS) ×2 IMPLANT
MANIFOLD NEPTUNE II (INSTRUMENTS) ×2 IMPLANT
NEEDLE INSUFFLATION 14GA 120MM (NEEDLE) ×2 IMPLANT
NS IRRIG 1000ML POUR BTL (IV SOLUTION) ×2 IMPLANT
PACK LAP CHOLE LZT030E (CUSTOM PROCEDURE TRAY) ×2 IMPLANT
PAD ARMBOARD 7.5X6 YLW CONV (MISCELLANEOUS) ×2 IMPLANT
SET BASIN LINEN APH (SET/KITS/TRAYS/PACK) ×2 IMPLANT
SET TUBE SMOKE EVAC HIGH FLOW (TUBING) ×2 IMPLANT
SLEEVE ENDOPATH XCEL 5M (ENDOMECHANICALS) ×2 IMPLANT
SUT MNCRL AB 4-0 PS2 18 (SUTURE) ×6 IMPLANT
SUT VICRYL 0 UR6 27IN ABS (SUTURE) ×2 IMPLANT
SYS BAG RETRIEVAL 10MM (BASKET) ×1
SYSTEM BAG RETRIEVAL 10MM (BASKET) ×1 IMPLANT
TROCAR ENDO BLADELESS 11MM (ENDOMECHANICALS) ×2 IMPLANT
TROCAR XCEL NON-BLD 5MMX100MML (ENDOMECHANICALS) ×2 IMPLANT
TROCAR XCEL UNIV SLVE 11M 100M (ENDOMECHANICALS) ×2 IMPLANT
TUBE CONNECTING 12X1/4 (SUCTIONS) ×2 IMPLANT
WARMER LAPAROSCOPE (MISCELLANEOUS) ×2 IMPLANT

## 2020-02-15 NOTE — Op Note (Signed)
Operative Note   Preoperative Diagnosis: Gallbladder sludge    Postoperative Diagnosis: Same   Procedure(s) Performed: Laparoscopic cholecystectomy   Surgeon: Lillia Abed C. Henreitta Leber, MD   Assistants: Franky Macho, MD    Anesthesia: General endotracheal   Anesthesiologist: Dr. Alva Garnet, MD    Specimens: Gallbladder    Estimated Blood Loss: Minimal    Blood Replacement: None    Complications: None    Operative Findings: Distended gallbladder    Procedure: The patient was taken to the operating room and placed supine. General endotracheal anesthesia was induced. Intravenous antibiotics were administered per protocol. An orogastric tube positioned to decompress the stomach. The abdomen was prepared and draped in the usual sterile fashion.    A supraumbilical incision was made, and the Veress technique was utilized. The saline drop test was good but the insufflation pressures elevated quicker than expected, so the Veress was reinserted and this time the saline drop test and low insufflation pressure confirmed intra peritoneal access and pneumoperitoneum to 15 mmHg with carbon dioxide was acheived. A 11 mm optiview port was placed through the supraumbilical region, and a 10 mm 0-degree operative laparoscope was introduced. The area underlying the trocar and Veress needle were inspected and without evidence of injury.  Remaining trocars were placed under direct vision. Two 5 mm ports were placed in the right abdomen, between the anterior axillary and midclavicular line.  A final 11 mm port was placed through the mid-epigastrium, near the falciform ligament.  I looked back from the 11 mm trocar in the epigastric region to the supraumbilical trocar and there was some omentum adherent to her prior scar, but no bowel was noted and no injury was noted.    The gallbladder was very distended and it was decompressed to allow for better manipulation. The fundus was elevated cephalad and the infundibulum was  retracted to the patient's right. The gallbladder/cystic duct junction was skeletonized. The cystic artery noted in the triangle of Calot and was also skeletonized.  We then continued liberal medial and lateral dissection until the critical view of safety was achieved.    The cystic duct was triply clipped and cystic artery was doubly clipped and both were divided. The gallbladder was then dissected from the liver bed with electrocautery. The specimen was placed in an Endopouch and was retrieved through the epigastric site.   Final inspection revealed acceptable hemostasis. Surgical SNOW was placed in the gallbladder bed.  Trocars were removed and pneumoperitoneum was released.  The epigastric and umbilical port sites were smaller than my finger and were not closed deep. Skin incisions were closed with 4-0 Monocryl subcuticular sutures and Dermabond. The patient was awakened from anesthesia and extubated without complication.    Algis Greenhouse, MD [facility] [address] [place], [place] [address] [phone] (office)

## 2020-02-15 NOTE — Anesthesia Postprocedure Evaluation (Signed)
Anesthesia Post Note  Patient: Alicia Gross  Procedure(s) Performed: LAPAROSCOPIC CHOLECYSTECTOMY (N/A )  Patient location during evaluation: PACU Anesthesia Type: General Level of consciousness: awake and alert and patient cooperative Pain management: satisfactory to patient Vital Signs Assessment: post-procedure vital signs reviewed and stable Respiratory status: spontaneous breathing Cardiovascular status: stable Postop Assessment: no apparent nausea or vomiting Anesthetic complications: no     Last Vitals:  Vitals:   02/15/20 1105 02/15/20 1115  BP:  139/82  Pulse:  87  Resp:  15  Temp:    SpO2: 100% 100%    Last Pain:  Vitals:   02/15/20 1115  TempSrc:   PainSc: Asleep                 Tailer Volkert

## 2020-02-15 NOTE — Interval H&P Note (Signed)
History and Physical Interval Note:  02/15/2020 8:24 AM  Alicia Gross  has presented today for surgery, with the diagnosis of Gallbladder sludge.  The various methods of treatment have been discussed with the patient and family. After consideration of risks, benefits and other options for treatment, the patient has consented to  Procedure(s): LAPAROSCOPIC CHOLECYSTECTOMY (N/A) as a surgical intervention.  The patient's history has been reviewed, patient examined, no change in status, stable for surgery.  I have reviewed the patient's chart and labs.  Questions were answered to the patient's satisfaction.    Patient without changes. Has some ekg changes that have been present since 2019, no complaints of CP or SOB, says she is active. She had an anxiety attack in 2019 and went to ED with negative troponins and ultimately was started on lexapro. She has had surgeries and procedures since that EKG without issues.   Will discuss with Dr. Pilar Plate.   Lucretia Roers

## 2020-02-15 NOTE — Discharge Instructions (Signed)
Discharge Laparoscopic Surgery Instructions:  Common Complaints: Right shoulder pain is common after laparoscopic surgery. This is secondary to the gas used in the surgery being trapped under the diaphragm.  Walk to help your body absorb the gas. This will improve in a few days. Pain at the port sites are common, especially the larger port sites. This will improve with time.  Some nausea is common and poor appetite. The main goal is to stay hydrated the first few days after surgery.   Diet/ Activity: Diet as tolerated. You may not have an appetite, but it is important to stay hydrated. Drink 64 ounces of water a day. Your appetite will return with time.  Shower per your regular routine daily.  Do not take hot showers. Take warm showers that are less than 10 minutes. Rest and listen to your body, but do not remain in bed all day.  Walk everyday for at least 15-20 minutes. Deep cough and move around every 1-2 hours in the first few days after surgery.  Do not lift > 10 lbs, perform excessive bending, pushing, pulling, squatting for 1-2 weeks after surgery.  Do not pick at the dermabond glue on your incision sites.  This glue film will remain in place for 1-2 weeks and will start to peel off.  Do not place lotions or balms on your incision unless instructed to specifically by Dr. Ieasha Boerema.   Pain Expectations and Narcotics: -After surgery you will have pain associated with your incisions and this is normal. The pain is muscular and nerve pain, and will get better with time. -You are encouraged and expected to take non narcotic medications like tylenol and ibuprofen (when able) to treat pain as multiple modalities can aid with pain treatment. -Narcotics are only used when pain is severe or there is breakthrough pain. -You are not expected to have a pain score of 0 after surgery, as we cannot prevent pain. A pain score of 3-4 that allows you to be functional, move, walk, and tolerate some activity is  the goal. The pain will continue to improve over the days after surgery and is dependent on your surgery. -Due to [place] law, we are only able to give a certain amount of pain medication to treat post operative pain, and we only give additional narcotics on a patient by patient basis.  -For most laparoscopic surgery, studies have shown that the majority of patients only need 10-15 narcotic pills, and for open surgeries most patients only need 15-20.   -Having appropriate expectations of pain and knowledge of pain management with non narcotics is important as we do not want anyone to become addicted to narcotic pain medication.  -Using ice packs in the first 48 hours and heating pads after 48 hours, wearing an abdominal binder (when recommended), and using over the counter medications are all ways to help with pain management.   -Simple acts like meditation and mindfulness practices after surgery can also help with pain control and research has proven the benefit of these practices.  Medication: Take tylenol and ibuprofen as needed for pain control, alternating every 4-6 hours.  Example:  Tylenol 1000mg @ 6am, 12noon, 6pm, 12midnight (Do not exceed 4000mg of tylenol a day). Ibuprofen 800mg @ 9am, 3pm, 9pm, 3am (Do not exceed 3600mg of ibuprofen a day).  Take Roxicodone for breakthrough pain every 4 hours.  Take Colace for constipation related to narcotic pain medication. If you do not have a bowel movement in 2 days, take Miralax   over the counter.  Drink plenty of water to also prevent constipation.   Contact Information: If you have questions or concerns, please call our office, [phone], Monday- Thursday 8AM-5PM and Friday 8AM-12Noon.  If it is after hours or on the weekend, please call [facility] Main Number, [phone], and ask to speak to the surgeon on call for Dr. Jaxyn Mestas at [facility] .    Laparoscopic Cholecystectomy, Care After This sheet gives you information about how to care for  yourself after your procedure. Your doctor may also give you more specific instructions. If you have problems or questions, contact your doctor. Follow these instructions at home: Care for cuts from surgery (incisions)   Follow instructions from your doctor about how to take care of your cuts from surgery. Make sure you: ? Wash your hands with soap and water before you change your bandage (dressing). If you cannot use soap and water, use hand sanitizer. ? Change your bandage as told by your doctor. ? Leave stitches (sutures), skin glue, or skin tape (adhesive) strips in place. They may need to stay in place for 2 weeks or longer. If tape strips get loose and curl up, you may trim the loose edges. Do not remove tape strips completely unless your doctor says it is okay.  Do not take baths, swim, or use a hot tub until your doctor says it is okay.   You may shower.  Check your surgical cut area every day for signs of infection. Check for: ? More redness, swelling, or pain. ? More fluid or blood. ? Warmth. ? Pus or a bad smell. Activity  Do not drive or use heavy machinery while taking prescription pain medicine.  Do not lift anything that is heavier than 10 lb (4.5 kg) until your doctor says it is okay.  Do not play contact sports until your doctor says it is okay.  Do not drive for 24 hours if you were given a medicine to help you relax (sedative).  Rest as needed. Do not return to work or school until your doctor says it is okay. General instructions  Take over-the-counter and prescription medicines only as told by your doctor.  To prevent or treat constipation while you are taking prescription pain medicine, your doctor may recommend that you: ? Drink enough fluid to keep your pee (urine) clear or pale yellow. ? Take over-the-counter or prescription medicines. ? Eat foods that are high in fiber, such as fresh fruits and vegetables, whole grains, and beans. ? Limit foods that are  high in fat and processed sugars, such as fried and sweet foods. Contact a doctor if:  You develop a rash.  You have more redness, swelling, or pain around your surgical cuts.  You have more fluid or blood coming from your surgical cuts.  Your surgical cuts feel warm to the touch.  You have pus or a bad smell coming from your surgical cuts.  You have a fever.  One or more of your surgical cuts breaks open. Get help right away if:  You have trouble breathing.  You have chest pain.  You have pain that is getting worse in your shoulders.  You faint or feel dizzy when you stand.  You have very bad pain in your belly (abdomen).  You are sick to your stomach (nauseous) for more than one day.  You have throwing up (vomiting) that lasts for more than one day.  You have leg pain. This information is not intended to replace   advice given to you by your health care provider. Make sure you discuss any questions you have with your health care provider. Document Revised: 09/30/2017 Document Reviewed: 04/05/2016 Elsevier Patient Education  2020 [employer]vier Inc.  

## 2020-02-15 NOTE — Transfer of Care (Signed)
Immediate Anesthesia Transfer of Care Note  Patient: Alicia Gross  Procedure(s) Performed: LAPAROSCOPIC CHOLECYSTECTOMY (N/A )  Patient Location: PACU  Anesthesia Type:General  Level of Consciousness: alert  and patient cooperative  Airway & Oxygen Therapy: Patient Spontanous Breathing and Patient connected to nasal cannula oxygen  Post-op Assessment: Report given to RN and Post -op Vital signs reviewed and stable  Post vital signs: Reviewed and stable  Last Vitals:  Vitals Value Taken Time  BP    Temp    Pulse 89 02/15/20 1033  Resp 11 02/15/20 1033  SpO2 95 % 02/15/20 1033  Vitals shown include unvalidated device data.  Last Pain:  Vitals:   02/15/20 0834  TempSrc: Oral         Complications: No apparent anesthesia complications

## 2020-02-15 NOTE — Anesthesia Preprocedure Evaluation (Signed)
Anesthesia Evaluation  Patient identified by MRN, date of birth, ID band Patient awake    Reviewed: Allergy & Precautions, NPO status   History of Anesthesia Complications (+) PONV and history of anesthetic complications  Airway Mallampati: II  TM Distance: >3 FB Neck ROM: Full    Dental  (+) Caps, Dental Advisory Given, Teeth Intact   Pulmonary former smoker,    Pulmonary exam normal breath sounds clear to auscultation       Cardiovascular Exercise Tolerance: Good hypertension, Pt. on medications Normal cardiovascular exam Rhythm:Regular Rate:Normal - Systolic murmurs, - Diastolic murmurs, - Friction Rub, - Carotid Bruit, - Peripheral Edema and - Systolic Click 13-Feb-2020 14:31:23 [facility] ROUTINE RECORD Normal sinus rhythm Inferior infarct , age undetermined Anterior infarct , age undetermined versus lead placement Abnormal ECG When compared with ECG of 04-Nov-2017 Criteria for Inferior infarct are now Present Confirmed by End, Christopher [phone]) on 02/13/2020 10:23:49 PM   Neuro/Psych PSYCHIATRIC DISORDERS Anxiety Depression RLS    GI/Hepatic negative GI ROS, CHRONIC CHOLECYSTITIS    Endo/Other    Renal/GU negative Renal ROS     Musculoskeletal   Abdominal   Peds  Hematology negative hematology ROS (+)   Anesthesia Other Findings   Reproductive/Obstetrics                             Anesthesia Physical Anesthesia Plan  ASA: II  Anesthesia Plan: General   Post-op Pain Management:    Induction: Intravenous  PONV Risk Score and Plan: 4 or greater and Ondansetron, Dexamethasone, Midazolam and Scopolamine patch - Pre-op  Airway Management Planned: Oral ETT  Additional Equipment:   Intra-op Plan:   Post-operative Plan: Extubation in OR  Informed Consent: I have reviewed the patients History and Physical, chart, labs and discussed the procedure  including the risks, benefits and alternatives for the proposed anesthesia with the patient or authorized representative who has indicated his/her understanding and acceptance.     Dental advisory given  Plan Discussed with: Surgeon  Anesthesia Plan Comments: (H/O Atypical chest pain in 2018, troponins negative. Patient had neck surgery in 2019 without any complications. patient has good exercise tolerance, no c/o chest pain or SOB during exertion. Risks of anesthesia, heart attack and stroke was explained. Patient understood the risks, and agreed to proceed.)        Anesthesia Quick Evaluation

## 2020-02-15 NOTE — Anesthesia Procedure Notes (Signed)
Procedure Name: Intubation Date/Time: 02/15/2020 9:29 AM Performed by: Vista Deck, CRNA Pre-anesthesia Checklist: Patient identified, Patient being monitored, Timeout performed, Emergency Drugs available and Suction available Patient Re-evaluated:Patient Re-evaluated prior to induction Oxygen Delivery Method: Circle System Utilized Preoxygenation: Pre-oxygenation with 100% oxygen Induction Type: IV induction Laryngoscope Size: Mac and 3 Grade View: Grade I Tube type: Oral Tube size: 7.0 mm Number of attempts: 1 Airway Equipment and Method: stylet Placement Confirmation: ETT inserted through vocal cords under direct vision,  positive ETCO2 and breath sounds checked- equal and bilateral Secured at: 23 cm Tube secured with: Tape Dental Injury: Teeth and Oropharynx as per pre-operative assessment

## 2020-02-18 LAB — SURGICAL PATHOLOGY

## 2020-02-22 ENCOUNTER — Telehealth: Payer: Self-pay | Admitting: Adult Health

## 2020-02-22 DIAGNOSIS — R9431 Abnormal electrocardiogram [ECG] [EKG]: Secondary | ICD-10-CM

## 2020-02-22 NOTE — Telephone Encounter (Signed)
Pt had abnormal EKG as part of pre op , ?MI, does have shortness of breath at times, will make cardiology referral

## 2020-02-26 ENCOUNTER — Encounter (HOSPITAL_COMMUNITY): Payer: 59

## 2020-02-26 ENCOUNTER — Other Ambulatory Visit (HOSPITAL_COMMUNITY): Payer: 59

## 2020-03-04 ENCOUNTER — Telehealth (INDEPENDENT_AMBULATORY_CARE_PROVIDER_SITE_OTHER): Payer: Self-pay | Admitting: General Surgery

## 2020-03-04 ENCOUNTER — Other Ambulatory Visit: Payer: Self-pay

## 2020-03-04 DIAGNOSIS — K828 Other specified diseases of gallbladder: Secondary | ICD-10-CM

## 2020-03-04 NOTE — Progress Notes (Signed)
[facility]  I am calling the patient for post operative evaluation due to the current COVID 19 pandemic.  The patient had a laparoscopic cholecystectomy on 4/16. The patient reports that they are doing well overall. Dermabond is peeling off. She has some pain with deep inhalation that is similar to prior to surgery but only with inhaling.  The are tolerating a diet, having good pain control otherwise, and having regular Bms.  The patient has no other concerns.   Will see the patient PRN.   Pathology: FINAL MICROSCOPIC DIAGNOSIS:   A. GALLBLADDER, CHOLECYSTECTOMY:  - Chronic cholecystitis   She sees Dr. Wyline Mood, regarding her EKG and the inferior lead MI question that has been chronic for years now.   Algis Greenhouse, MD [facility] [address] [place], [place] [address] [phone] (office)

## 2020-03-05 ENCOUNTER — Telehealth: Payer: Self-pay | Admitting: Adult Health

## 2020-03-05 ENCOUNTER — Ambulatory Visit (INDEPENDENT_AMBULATORY_CARE_PROVIDER_SITE_OTHER): Payer: 59 | Admitting: Cardiology

## 2020-03-05 ENCOUNTER — Telehealth: Payer: Self-pay | Admitting: Cardiology

## 2020-03-05 ENCOUNTER — Encounter: Payer: Self-pay | Admitting: Cardiology

## 2020-03-05 ENCOUNTER — Other Ambulatory Visit: Payer: Self-pay

## 2020-03-05 VITALS — BP 132/82 | HR 81 | Ht 67.0 in | Wt 214.0 lb

## 2020-03-05 DIAGNOSIS — R9431 Abnormal electrocardiogram [ECG] [EKG]: Secondary | ICD-10-CM | POA: Diagnosis not present

## 2020-03-05 DIAGNOSIS — I10 Essential (primary) hypertension: Secondary | ICD-10-CM | POA: Diagnosis not present

## 2020-03-05 MED ORDER — BUSPIRONE HCL 5 MG PO TABS: 5.0000 mg | ORAL_TABLET | Freq: Three times a day (TID) | ORAL | 3 refills | Status: DC

## 2020-03-05 NOTE — Patient Instructions (Signed)
Your physician wants you to follow-up in: PENDING WITH DR BRANCH  Your physician recommends that you continue on your current medications as directed. Please refer to the Current Medication list given to you today.  Your physician has requested that you have an echocardiogram. Echocardiography is a painless test that uses sound waves to create images of your heart. It provides your doctor with information about the size and shape of your heart and how well your heart's chambers and valves are working. This procedure takes approximately one hour. There are no restrictions for this procedure.  Thank you for choosing [facility]!!   

## 2020-03-05 NOTE — Progress Notes (Signed)
Clinical Summary Alicia Gross is a 54 y.o.female seen for follow up of the following medical problems. Last seen by our practice in 2019 by Dr Allyson Sabal  1.Abnormal EKG - noted during recent preop evaluation for lap chole  - CAD risk factors: paternal grandmother MI late 18s, paternal great aunt and uncle MIs, HL,  - remains active. No exertional chest pains - walks up 30 min daily, no exertional symptoms - can have some SOB she associates with anxiety.    2. Chest pain - from 2019 evaluation by Dr Allyson Sabal thought to be atypical     3. Hyperlipidemia - history of statin intolerance - she is on fish oil   Past Medical History:  Diagnosis Date  . Anxiety   . Constipation   . Elevated cholesterol   . Elevated cholesterol with high triglycerides 12/13/2016  . Elevated hemoglobin A1c 12/13/2016  . Hypertension   . Mental disorder    anxiety  . Obesity 09/09/2014  . Pedal edema   . PONV (postoperative nausea and vomiting)   . Rosacea   . Shingles   . Tick bite 06/2018  . Vitamin D deficiency 12/13/2016     No Known Allergies   Current Outpatient Medications  Medication Sig Dispense Refill  . ALPRAZolam (XANAX) 1 MG tablet TAKE 1/2 TO 1 TABLET BY MOUTH EVERY 8 HOURS AS NEEDED FOR ANXIETY. 60 tablet 0  . ascorbic acid (VITAMIN C) 500 MG tablet Take 500 mg by mouth 3 (three) times a week.    . Biotin 81856 MCG TABS Take 10,000 mcg by mouth daily.    . cetirizine (ZYRTEC) 10 MG tablet Take 10 mg by mouth daily.    . Cholecalciferol (VITAMIN D3) 50 MCG (2000 UT) capsule Take 2,000 Units by mouth daily.     . cyclobenzaprine (FLEXERIL) 10 MG tablet Take 10 mg by mouth 3 (three) times daily as needed for muscle spasms.     . diphenhydramine-acetaminophen (TYLENOL PM) 25-500 MG TABS tablet Take 1 tablet by mouth at bedtime as needed (sleep).    . docusate sodium (COLACE) 100 MG capsule Take 1 capsule (100 mg total) by mouth 2 (two) times daily. 60 capsule 2  . escitalopram  (LEXAPRO) 20 MG tablet TAKE ONE TABLET BY MOUTH DAILY (Patient taking differently: Take 20 mg by mouth daily. ) 90 tablet 3  . hydrochlorothiazide (HYDRODIURIL) 50 MG tablet TAKE ONE TABLET (50MG )BY MOUTH DAILY (Patient taking differently: Take 50 mg by mouth daily. ) 90 tablet 3  . Magnesium 500 MG TABS Take 500 mg by mouth daily.     . Multiple Vitamin (MULTIVITAMIN) tablet Take 1 tablet by mouth daily.    . Omega-3 Fatty Acids (FISH OIL) 1000 MG CAPS Take 2,000 mg by mouth daily.    . ondansetron (ZOFRAN) 8 MG tablet Take 1 tablet (8 mg total) by mouth every 8 (eight) hours as needed for nausea or vomiting. 20 tablet 1  . ondansetron (ZOFRAN-ODT) 8 MG disintegrating tablet Take 8 mg by mouth every 8 (eight) hours as needed for nausea or vomiting.     oxyCODONE (ROXICODONE) 5 MG immediate release tablet Take 1 tablet (5 mg total) by mouth every 4 (four) hours as needed for severe pain or breakthrough pain. 15 tablet 0  . psyllium (METAMUCIL) 58.6 % powder Take 1 packet by mouth daily.    Marland Kitchen rOPINIRole (REQUIP) 0.5 MG tablet Take 1/2 tablet at hs for 2 days then 1 tablet at  hs (Patient not taking: Reported on 01/16/2020) 30 tablet 1   No current facility-administered medications for this visit.     Past Surgical History:  Procedure Laterality Date  . ABDOMINAL HYSTERECTOMY    . BUNIONECTOMY Right   . CERVICAL DISCECTOMY     x2 with fusion  . CHOLECYSTECTOMY N/A 02/15/2020   Procedure: LAPAROSCOPIC CHOLECYSTECTOMY;  Surgeon: Virl Cagey, MD;  Location: [facility];  Service: General;  Laterality: N/A;  . COLONOSCOPY    . COLONOSCOPY N/A 11/26/2015   Procedure: COLONOSCOPY;  Surgeon: Rogene Houston, MD;  Location: [facility] SUITE;  Service: Endoscopy;  Laterality: N/A;  1030  . ruptured disc     lumbar x2  . SKIN BIOPSY  10/2016     No Known Allergies    Family History  Problem Relation Age of Onset  . Diabetes Mother   . Dementia Mother   . Hypertension Father   . Diabetes  Father        prediabetic  . Bladder Cancer Father   . Heart disease Paternal Grandmother   . Other Sister        SVT; ablation     Social History Ms. Boye reports that she quit smoking about 30 years ago. Her smoking use included cigarettes. She has a 1.25 pack-year smoking history. She has never used smokeless tobacco. Ms. Gorey reports no history of alcohol use.   Review of Systems CONSTITUTIONAL: No weight loss, fever, chills, weakness or fatigue.  HEENT: Eyes: No visual loss, blurred vision, double vision or yellow sclerae.No hearing loss, sneezing, congestion, runny nose or sore throat.  SKIN: No rash or itching.  CARDIOVASCULAR: per hpi RESPIRATORY: No shortness of breath, cough or sputum.  GASTROINTESTINAL: No anorexia, nausea, vomiting or diarrhea. No abdominal pain or blood.  GENITOURINARY: No burning on urination, no polyuria NEUROLOGICAL: No headache, dizziness, syncope, paralysis, ataxia, numbness or tingling in the extremities. No change in bowel or bladder control.  MUSCULOSKELETAL: No muscle, back pain, joint pain or stiffness.  LYMPHATICS: No enlarged nodes. No history of splenectomy.  PSYCHIATRIC: No history of depression or anxiety.  ENDOCRINOLOGIC: No reports of sweating, cold or heat intolerance. No polyuria or polydipsia.  Marland Kitchen   Physical Examination Today's Vitals   03/05/20 0857  BP: 132/82  Pulse: 81  SpO2: 98%  Weight: 214 lb (97.1 kg)  Height: 5\' 7"  (1.702 m)   Body mass index is 33.52 kg/m.  Gen: resting comfortably, no acute distress HEENT: no scleral icterus, pupils equal round and reactive, no palptable cervical adenopathy,  CV: RRR, no mr/g, no jvd Resp: Clear to auscultation bilaterally GI: abdomen is soft, non-tender, non-distended, normal bowel sounds, no hepatosplenomegaly MSK: extremities are warm, no edema.  Skin: warm, no rash Neuro:  no focal deficits Psych: appropriate affect      Assessment and Plan  1. Abnormal EKG -  potential inferior Qwaves on EKG, I think this is more likely rS complexes from a LAFB but will obtain an echo to evaluate for any underlying wall motion abnormality.  - no significant exertional symptoms to suggest active ischemia   If benign echo then f/u just as needed  Arnoldo Lenis, M.D.

## 2020-03-05 NOTE — Telephone Encounter (Signed)
Pt wants to try buspar, will rx buspar 5 mg tid to start, has echo 04/09/20 she says

## 2020-03-05 NOTE — Telephone Encounter (Signed)
Had question about increasing lexapro, already at 20 mg daily, could add buspar, just let me know

## 2020-03-05 NOTE — Telephone Encounter (Signed)
  Precert needed for:   Echo   Location: [facility]    Date: April 09, 2020

## 2020-04-09 ENCOUNTER — Ambulatory Visit (INDEPENDENT_AMBULATORY_CARE_PROVIDER_SITE_OTHER): Payer: 59

## 2020-04-09 ENCOUNTER — Other Ambulatory Visit: Payer: Self-pay

## 2020-04-09 DIAGNOSIS — R9431 Abnormal electrocardiogram [ECG] [EKG]: Secondary | ICD-10-CM

## 2020-04-10 ENCOUNTER — Other Ambulatory Visit: Payer: Self-pay | Admitting: Adult Health

## 2020-04-15 ENCOUNTER — Telehealth: Payer: Self-pay | Admitting: *Deleted

## 2020-04-15 NOTE — Telephone Encounter (Signed)
-----   Message from Antoine Poche, MD sent at 04/15/2020 11:42 AM EDT ----- Echo looks good, normal heart function without any signs of prior heart damage.No further heart testing is needed, just f/u as needed   Dominga Ferry MD

## 2020-04-15 NOTE — Telephone Encounter (Signed)
Pt voiced understanding - routed to pcp  

## 2020-05-14 ENCOUNTER — Other Ambulatory Visit: Payer: Self-pay | Admitting: General Surgery

## 2020-06-02 ENCOUNTER — Other Ambulatory Visit: Payer: Self-pay | Admitting: Adult Health

## 2020-06-13 ENCOUNTER — Other Ambulatory Visit: Payer: Self-pay | Admitting: Women's Health

## 2020-06-17 ENCOUNTER — Other Ambulatory Visit: Payer: Self-pay | Admitting: Adult Health

## 2020-08-06 ENCOUNTER — Other Ambulatory Visit: Payer: Self-pay | Admitting: Adult Health

## 2020-08-13 ENCOUNTER — Other Ambulatory Visit: Payer: Self-pay | Admitting: Adult Health

## 2020-10-03 ENCOUNTER — Other Ambulatory Visit: Payer: Self-pay | Admitting: Adult Health

## 2020-12-02 ENCOUNTER — Other Ambulatory Visit (HOSPITAL_COMMUNITY): Payer: Self-pay | Admitting: Adult Health

## 2020-12-02 DIAGNOSIS — Z1231 Encounter for screening mammogram for malignant neoplasm of breast: Secondary | ICD-10-CM

## 2020-12-03 ENCOUNTER — Other Ambulatory Visit: Payer: Self-pay | Admitting: Adult Health

## 2021-02-02 ENCOUNTER — Other Ambulatory Visit: Payer: Self-pay

## 2021-02-02 ENCOUNTER — Other Ambulatory Visit: Payer: Self-pay | Admitting: Adult Health

## 2021-02-02 ENCOUNTER — Ambulatory Visit (HOSPITAL_COMMUNITY)
Admission: RE | Admit: 2021-02-02 | Discharge: 2021-02-02 | Disposition: A | Discharge status: Home / Self Care | Payer: 59 | Source: Ambulatory Visit | Attending: Adult Health | Admitting: Adult Health

## 2021-02-02 DIAGNOSIS — Z1231 Encounter for screening mammogram for malignant neoplasm of breast: Secondary | ICD-10-CM | POA: Diagnosis present

## 2021-03-18 ENCOUNTER — Other Ambulatory Visit: Payer: Self-pay | Admitting: Adult Health

## 2021-04-17 ENCOUNTER — Other Ambulatory Visit: Payer: Self-pay

## 2021-04-17 ENCOUNTER — Encounter: Payer: Self-pay | Admitting: Adult Health

## 2021-04-17 ENCOUNTER — Ambulatory Visit (INDEPENDENT_AMBULATORY_CARE_PROVIDER_SITE_OTHER): Payer: 59 | Admitting: Adult Health

## 2021-04-17 VITALS — BP 106/69 | HR 92 | Ht 66.0 in | Wt 221.0 lb

## 2021-04-17 DIAGNOSIS — E782 Mixed hyperlipidemia: Secondary | ICD-10-CM | POA: Diagnosis not present

## 2021-04-17 DIAGNOSIS — Z01419 Encounter for gynecological examination (general) (routine) without abnormal findings: Secondary | ICD-10-CM

## 2021-04-17 DIAGNOSIS — Z1211 Encounter for screening for malignant neoplasm of colon: Secondary | ICD-10-CM

## 2021-04-17 DIAGNOSIS — Z90710 Acquired absence of both cervix and uterus: Secondary | ICD-10-CM | POA: Diagnosis not present

## 2021-04-17 LAB — HEMOCCULT GUIAC POC 1CARD (OFFICE): Fecal Occult Blood, POC: NEGATIVE

## 2021-04-17 NOTE — Progress Notes (Signed)
Patient ID: Alicia Gross, female   DOB: 06-02-1966, 55 y.o.   MRN: [id number] History of Present Illness:  Alicia Gross is a 55 year old white female, married, sp hysterectomy, in for well woman gyn exam. She works for [employer] at home. PCP is Dr Dimas Aguas.   Current Medications, Allergies, Past Medical History, Past Surgical History, Family History and Social History were reviewed in [employer] record.     Review of Systems:  Patient denies any headaches, hearing loss, fatigue, blurred vision, shortness of breath, chest pain, abdominal pain, problems with bowel movements, urination, or intercourse. No joint pain or mood swings.    Physical Exam:BP 106/69 (BP Location: Left Arm, Patient Position: Sitting, Cuff Size: Large)   Pulse 92   Ht 5\' 6"  (1.676 m)   Wt 221 lb (100.2 kg)   BMI 35.67 kg/m   General:  Well developed, well nourished, no acute distress Skin:  Warm and dry Neck:  Midline trachea, normal thyroid, good ROM, no lymphadenopathy Lungs; Clear to auscultation bilaterally Breast:  No dominant palpable mass, retraction, or nipple discharge Cardiovascular: Regular rate and rhythm Abdomen:  Soft, non tender, no hepatosplenomegaly Pelvic:  External genitalia is normal in appearance, no lesions.  The vagina is normal in appearance. Urethra has no lesions or masses. The cervix and uterus are absent. No adnexal masses or tenderness noted.Bladder is non tender, no masses felt. Rectal: Good sphincter tone, no polyps, + hemorrhoids felt.  Hemoccult negative. Extremities/musculoskeletal:  No swelling or varicosities noted, no clubbing or cyanosis Psych:  No mood changes, alert and cooperative,seems happy AA is 0  Fall risk is low Depression screen [facility] 2/9 04/17/2021 01/16/2020 12/13/2018  Decreased Interest 0 0 0  Down, Depressed, Hopeless 0 0 0  PHQ - 2 Score 0 0 0  Altered sleeping 0 - -  Tired, decreased energy 1 - -  Change in appetite 1 - -  Feeling bad or failure  about yourself  0 - -  Trouble concentrating 0 - -  Moving slowly or fidgety/restless 0 - -  Suicidal thoughts 0 - -  PHQ-9 Score 2 - -  Difficult doing work/chores - - -    GAD 7 : Generalized Anxiety Score 04/17/2021  Nervous, Anxious, on Edge 0  Control/stop worrying 0  Worry too much - different things 0  Trouble relaxing 0  Restless 0  Easily annoyed or irritable 0  Afraid - awful might happen 0  Total GAD 7 Score 0      Upstream - 04/17/21 0836       Pregnancy Intention Screening   Does the patient want to become pregnant in the next year? N/A    Does the patient's partner want to become pregnant in the next year? N/A    Would the patient like to discuss contraceptive options today? N/A      Contraception Wrap Up   Current Method Female Sterilization   hyst   End Method Female Sterilization   hyst   Contraception Counseling Provided No            Exam performed without chaperone with verbal consent.  Impression and Plan: 1. Encounter for well woman exam with routine gynecological exam Physical in 1 year Check CBC,CMP,TSH and lipids  Mammogram yearly Colonoscopy per GI   2. Encounter for screening fecal occult blood testing   3. Elevated cholesterol with high triglycerides Check lipids  4. S/P hysterectomy

## 2021-04-18 LAB — COMPREHENSIVE METABOLIC PANEL
ALT: 29 IU/L (ref 0–32)
AST: 31 IU/L (ref 0–40)
Albumin/Globulin Ratio: 2.4 — ABNORMAL HIGH (ref 1.2–2.2)
Albumin: 4.6 g/dL (ref 3.8–4.9)
Alkaline Phosphatase: 109 IU/L (ref 44–121)
BUN/Creatinine Ratio: 26 — ABNORMAL HIGH (ref 9–23)
BUN: 18 mg/dL (ref 6–24)
Bilirubin Total: 0.5 mg/dL (ref 0.0–1.2)
CO2: 26 mmol/L (ref 20–29)
Calcium: 9.7 mg/dL (ref 8.7–10.2)
Chloride: 99 mmol/L (ref 96–106)
Creatinine, Ser: 0.69 mg/dL (ref 0.57–1.00)
Globulin, Total: 1.9 g/dL (ref 1.5–4.5)
Glucose: 105 mg/dL — ABNORMAL HIGH (ref 65–99)
Potassium: 3.7 mmol/L (ref 3.5–5.2)
Sodium: 142 mmol/L (ref 134–144)
Total Protein: 6.5 g/dL (ref 6.0–8.5)
eGFR: 101 mL/min/{1.73_m2} (ref >59)

## 2021-04-18 LAB — CBC
Hematocrit: 43.3 % (ref 34.0–46.6)
Hemoglobin: 14.9 g/dL (ref 11.1–15.9)
MCH: 30.8 pg (ref 26.6–33.0)
MCHC: 34.4 g/dL (ref 31.5–35.7)
MCV: 90 fL (ref 79–97)
Platelets: 141 10*3/uL — ABNORMAL LOW (ref 150–450)
RBC: 4.84 x10E6/uL (ref 3.77–5.28)
RDW: 13.3 % (ref 11.7–15.4)
WBC: 8.9 10*3/uL (ref 3.4–10.8)

## 2021-04-18 LAB — TSH: TSH: 1.98 u[IU]/mL (ref 0.450–4.500)

## 2021-04-18 LAB — LIPID PANEL
Chol/HDL Ratio: 4.8 ratio — ABNORMAL HIGH (ref 0.0–4.4)
Cholesterol, Total: 160 mg/dL (ref 100–199)
HDL: 33 mg/dL — ABNORMAL LOW (ref >39)
LDL Chol Calc (NIH): 83 mg/dL (ref 0–99)
Triglycerides: 266 mg/dL — ABNORMAL HIGH (ref 0–149)
VLDL Cholesterol Cal: 44 mg/dL — ABNORMAL HIGH (ref 5–40)

## 2021-05-06 ENCOUNTER — Other Ambulatory Visit: Payer: Self-pay

## 2021-05-06 ENCOUNTER — Telehealth: Payer: Self-pay | Admitting: Adult Health

## 2021-05-06 ENCOUNTER — Ambulatory Visit
Admission: EM | Admit: 2021-05-06 | Discharge: 2021-05-06 | Disposition: A | Discharge status: Home / Self Care | Payer: 59 | Attending: Family Medicine | Admitting: Family Medicine

## 2021-05-06 ENCOUNTER — Ambulatory Visit (INDEPENDENT_AMBULATORY_CARE_PROVIDER_SITE_OTHER): Payer: 59

## 2021-05-06 DIAGNOSIS — R079 Chest pain, unspecified: Secondary | ICD-10-CM | POA: Diagnosis not present

## 2021-05-06 DIAGNOSIS — R0781 Pleurodynia: Secondary | ICD-10-CM | POA: Diagnosis not present

## 2021-05-06 NOTE — Telephone Encounter (Signed)
Reginna had text me to call her. She has had a course of steroids and not a Zpack from PCP, but is having right flank pain,radiates into shoulder, hurts to move and with deep breaths, I recommend going to Urgent Care to be evaluated now.

## 2021-05-06 NOTE — Discharge Instructions (Addendum)
You may take over the counter ibuprofen 800mg  (4 tablets) every 8 hours with food.

## 2021-05-06 NOTE — ED Triage Notes (Signed)
Pt presents with right rib pain that radiates up to shoulder , treated for bronchitis last week and had chest x ray when first began

## 2021-05-07 NOTE — ED Provider Notes (Signed)
[facility]   [id number] 05/06/21 Arrival Time: 1700  ASSESSMENT & PLAN:  1. Rib pain on right side    Suspect MSK etiology. Without assoc CP or SBO. No suspicion for PE. Discussed. VSS.  I have personally viewed the imaging studies ordered this visit. No rib fracture appreciated. No pneumothorax. No signs of infection.  She is comfortable with OTC analgesics and home observation.    Follow-up Information     [facility] EMERGENCY DEPARTMENT.   Specialty: Emergency Medicine Why: If symptoms worsen in any way. Contact information: [address] [id number] mc [place] [address] [phone]                Reviewed expectations re: course of current medical issues. Questions answered. Outlined signs and symptoms indicating need for more acute intervention. Patient verbalized understanding. After Visit Summary given.   SUBJECTIVE:  History from: patient. Alicia Gross is a 55 y.o. female who presents with complaint of non-traumatic R sided "rib pain"; past week; reports being tx for bronchitis by PCP. Some coughing. No SOB or specific chest pain/pressure reported. Normal PO intake without n/v/d.  Denies: fatigue, irregular heart beat, lower extremity edema, near-syncope, orthopnea, palpitations, paroxysmal nocturnal dyspnea, and syncope. Aggravating factors: certain movements at times; "pain catches me". Alleviating factors: have not been identified. Recent illnesses: none. Fever: absent. Ambulatory without assistance. Declines street drug use.  Social History   Tobacco Use  Smoking Status Former   Packs/day: 0.25   Years: 5.00   Pack years: 1.25   Types: Cigarettes   Quit date: 02/12/1990   Years since quitting: 31.2  Smokeless Tobacco Never   Social History   Substance and Sexual Activity  Alcohol Use No    OBJECTIVE:  Vitals:   05/06/21 1739  BP: 112/79  Pulse: 100  Resp: 20  Temp: 98.1 F  (36.7 C)  SpO2: 96%    General appearance: alert, oriented, no acute distress Eyes: PERRLA; EOMI; conjunctivae normal HENT: normocephalic; atraumatic Neck: supple with FROM Lungs: without labored respirations; speaks full sentences without difficulty; CTAB Heart: regular rate Chest Wall: with tenderness to palpation over R ant lower ribs Abdomen: soft, non-tender; no guarding or rebound tenderness Extremities: without edema; without calf swelling or tenderness; symmetrical without gross deformities Skin: warm and dry; without rash or lesions Neuro: normal gait Psychological: alert and cooperative; normal mood and affect   Imaging: DG Chest 2 View  Result Date: 05/06/2021 CLINICAL DATA:  Right-sided lower chest pain EXAM: CHEST - 2 VIEW COMPARISON:  11/04/2017 FINDINGS: The heart size and mediastinal contours are within normal limits. Both lungs are clear. The visualized skeletal structures are unremarkable. Status post ACDF. IMPRESSION: No active cardiopulmonary disease. Electronically Signed   By: Signa Kell M.D.   On: 05/06/2021 18:39     No Known Allergies  Past Medical History:  Diagnosis Date   Anxiety    Constipation    Elevated cholesterol    Elevated cholesterol with high triglycerides 12/13/2016   Elevated hemoglobin A1c 12/13/2016   Hypertension    Mental disorder    anxiety   Obesity 09/09/2014   Pedal edema    PONV (postoperative nausea and vomiting)    Rosacea    Shingles    Tick bite 06/2018   Vitamin D deficiency 12/13/2016   Social History   Socioeconomic History   Marital status: Married    Spouse name: Not on file   Number of children: Not on file  Years of education: Not on file   Highest education level: Not on file  Occupational History   Not on file  Tobacco Use   Smoking status: Former    Packs/day: 0.25    Years: 5.00    Pack years: 1.25    Types: Cigarettes    Quit date: 02/12/1990    Years since quitting: 31.2   Smokeless tobacco:  Never  Vaping Use   Vaping Use: Never used  Substance and Sexual Activity   Alcohol use: No   Drug use: No   Sexual activity: Yes    Birth control/protection: Surgical    Comment: hyst  Other Topics Concern   Not on file  Social History Narrative   Not on file   Social Determinants of Health   Financial Resource Strain: Low Risk    Difficulty of Paying Living Expenses: Not hard at all  Food Insecurity: No Food Insecurity   Worried About Programme researcher, broadcasting/film/video in the Last Year: Never true   Ran Out of Food in the Last Year: Never true  Transportation Needs: No Transportation Needs   Lack of Transportation (Medical): No   Lack of Transportation (Non-Medical): No  Physical Activity: Insufficiently Active   Days of Exercise per Week: 2 days   Minutes of Exercise per Session: 30 min  Stress: No Stress Concern Present   Feeling of Stress : Not at all  Social Connections: Socially Integrated   Frequency of Communication with Friends and Family: More than three times a week   Frequency of Social Gatherings with Friends and Family: Three times a week   Attends Religious Services: More than 4 times per year   Active Member of Clubs or Organizations: Yes   Attends Engineer, structural: More than 4 times per year   Marital Status: Married  Catering manager Violence: Not At Risk   Fear of Current or Ex-Partner: No   Emotionally Abused: No   Physically Abused: No   Sexually Abused: No   Family History  Problem Relation Age of Onset   Diabetes Mother    Dementia Mother    Hypertension Father    Diabetes Father        prediabetic   Bladder Cancer Father    Heart disease Paternal Grandmother    Other Sister        SVT; ablation   Past Surgical History:  Procedure Laterality Date   ABDOMINAL HYSTERECTOMY     BUNIONECTOMY Right    CERVICAL DISCECTOMY     x2 with fusion   CHOLECYSTECTOMY N/A 02/15/2020   Procedure: LAPAROSCOPIC CHOLECYSTECTOMY;  Surgeon: Lucretia Roers, MD;  Location: [facility] ORS;  Service: General;  Laterality: N/A;   COLONOSCOPY     COLONOSCOPY N/A 11/26/2015   Procedure: COLONOSCOPY;  Surgeon: Malissa Hippo, MD;  Location: [facility] END[facility] SUITE;  Service: Endoscopy;  Laterality: N/A;  1030   ruptured disc     lumbar x2   SKIN BIOPSY  10/2016      Mardella Layman, MD 05/07/21 1008

## 2021-05-19 ENCOUNTER — Other Ambulatory Visit: Payer: Self-pay | Admitting: Adult Health

## 2021-06-01 ENCOUNTER — Other Ambulatory Visit: Payer: Self-pay | Admitting: Adult Health

## 2021-06-11 IMAGING — US US ABDOMEN COMPLETE
1 series · 13 of 25 positions shown · non-contrast
Comparison: 03/08/2002 CT abdomen/pelvis.

CLINICAL DATA: Right upper quadrant abdominal pain, nausea and
bloating intermittently for 1 year.

EXAM:
ABDOMEN ULTRASOUND COMPLETE

[Series 1: us abdomen complete · 13 of 94 slices shown]
[im 1/94]
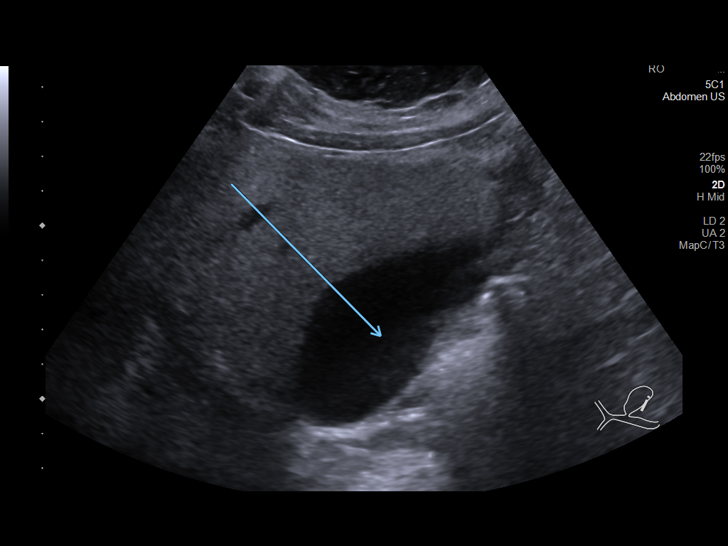
[im 8/94]
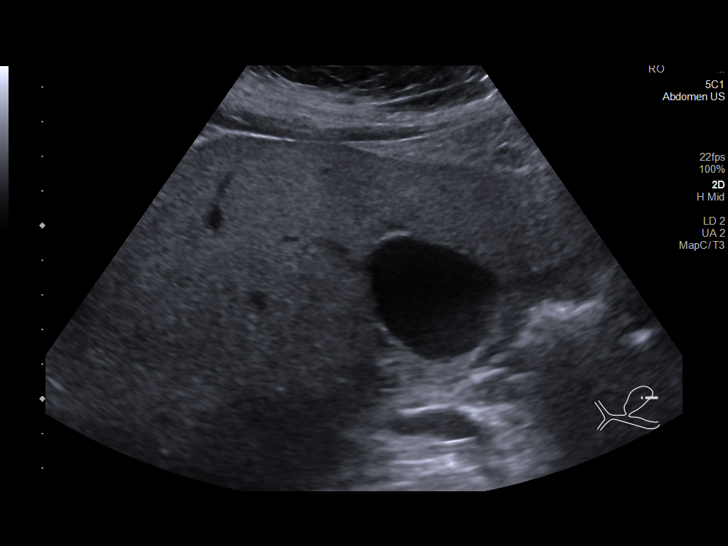
[im 16/94]
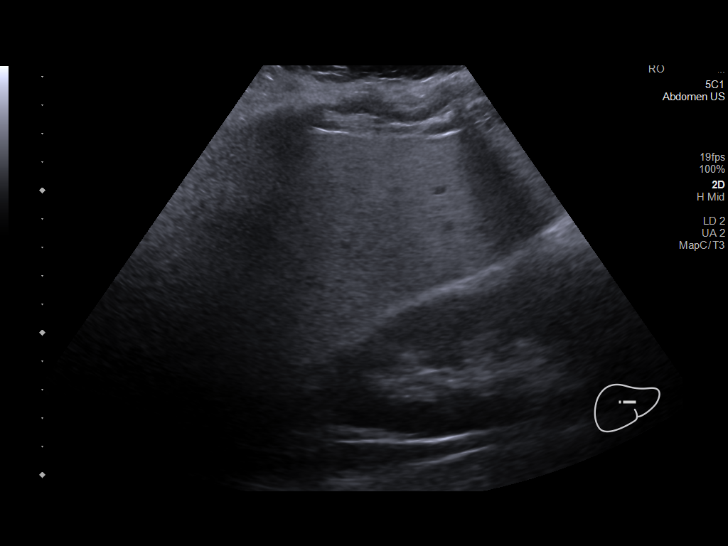
[im 24/94]
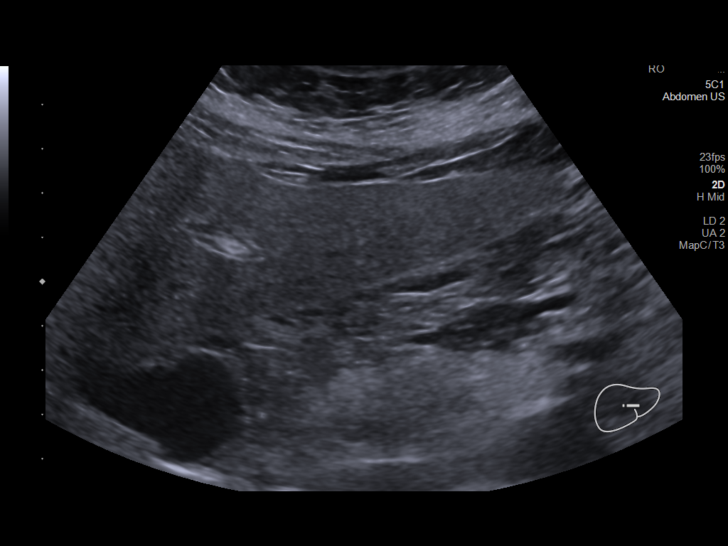
[im 32/94]
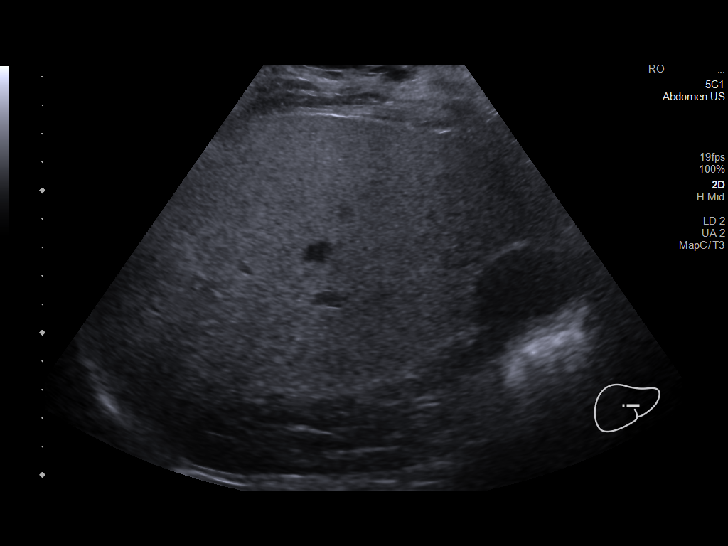
[im 39/94]
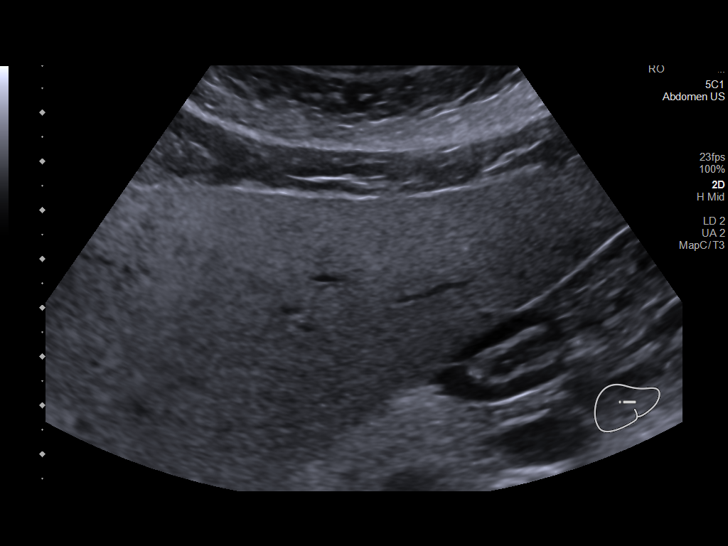
[im 47/94]
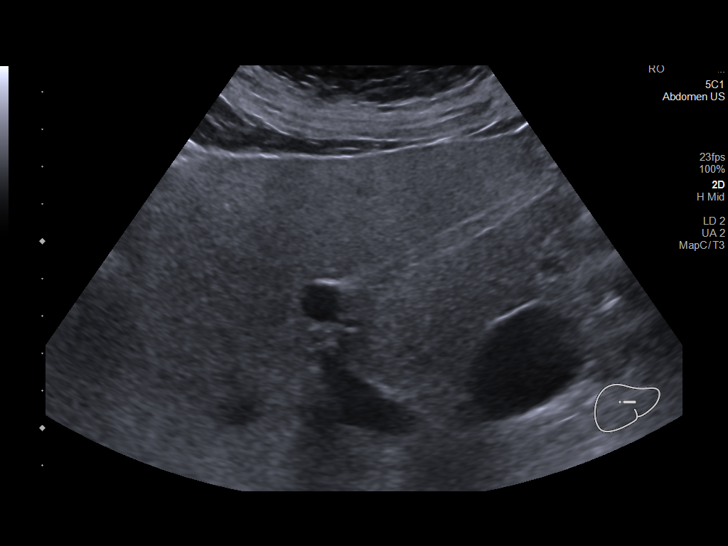
[im 55/94]
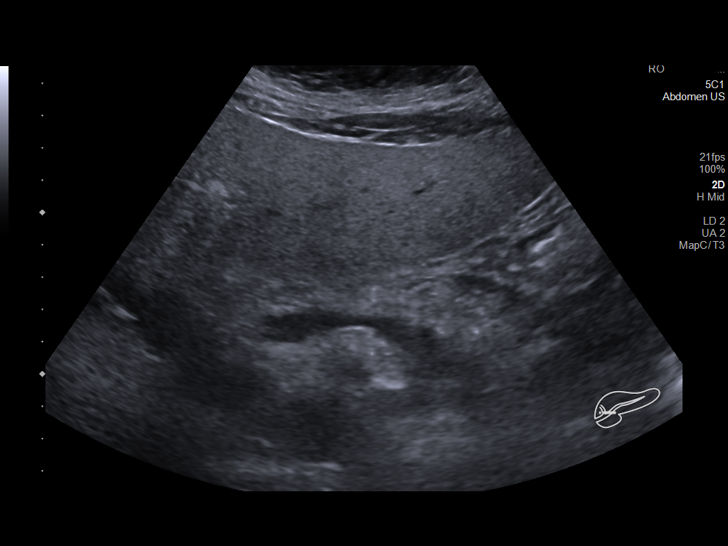
[im 63/94]
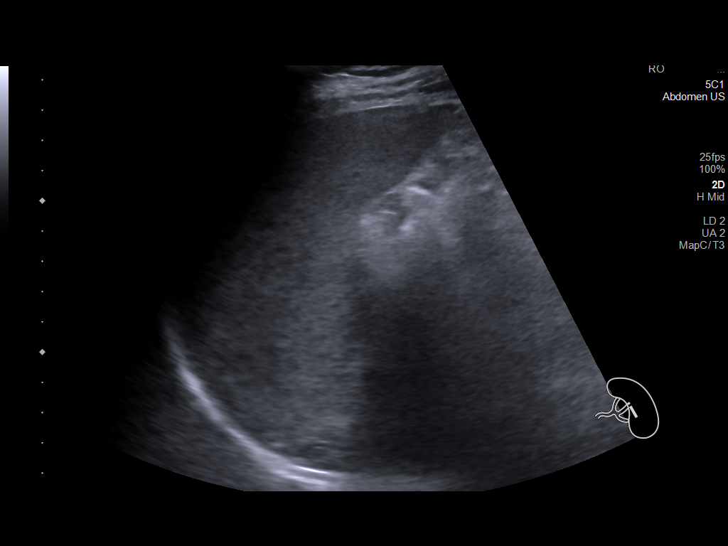
[im 70/94]
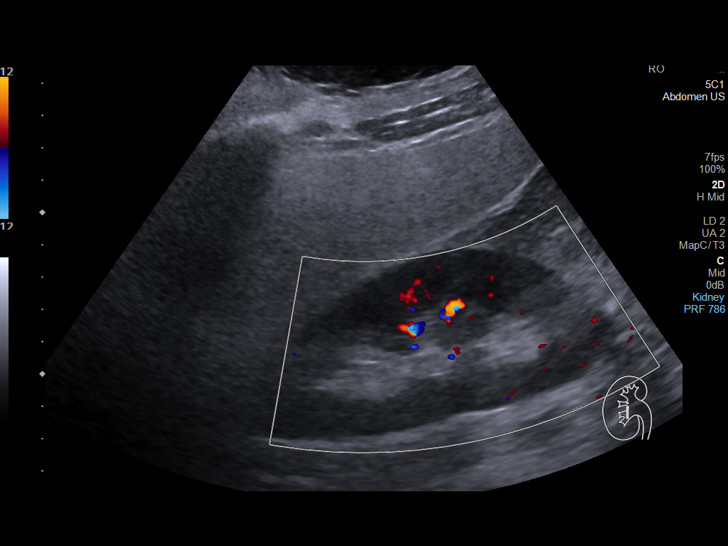
[im 78/94]
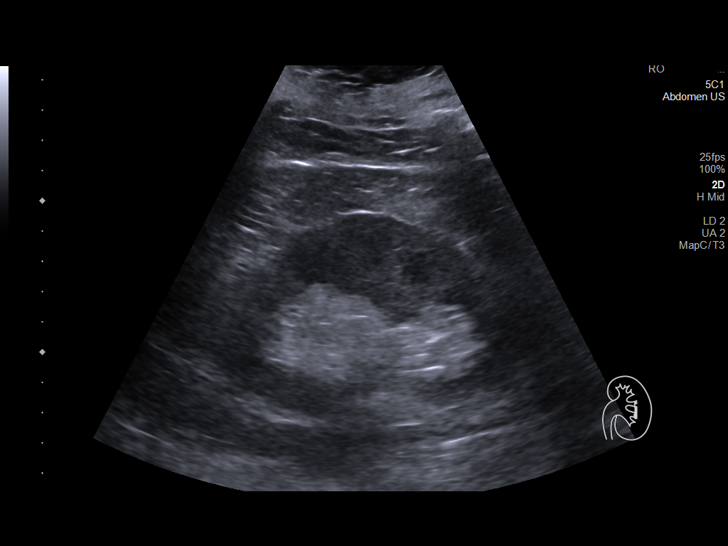
[im 86/94]
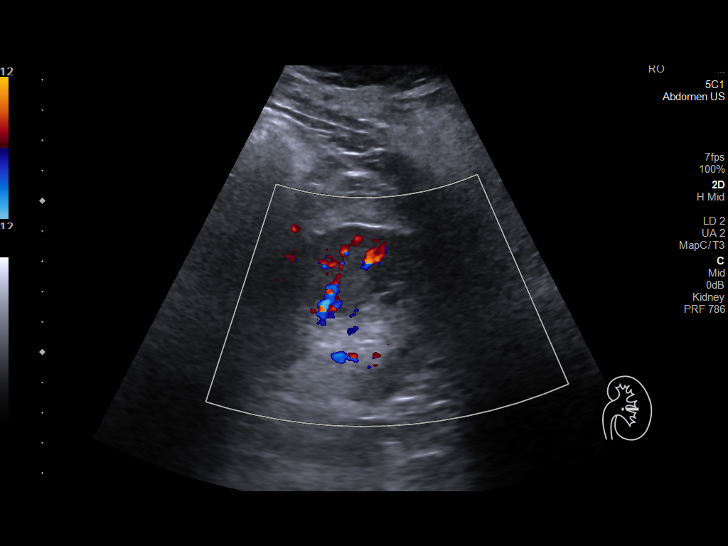
[im 94/94]
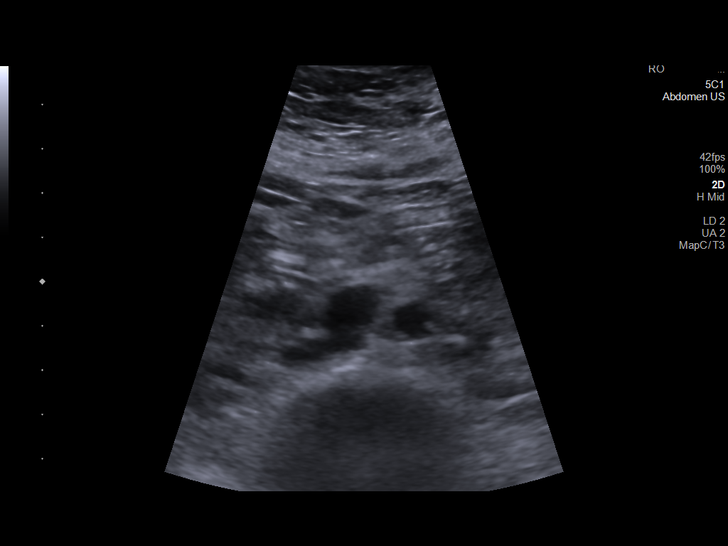

[13 of 25 positions shown; findings below may reference images not displayed]

FINDINGS: Gallbladder: Small amount of layering sludge within the otherwise
normal gallbladder with no cholelithiasis, gallbladder wall
thickening, pericholecystic fluid or sonographic Murphy's sign.

Common bile duct: Diameter: 6 mm, upper normal, with
nonvisualization of distal CBD due to bowel gas.

Liver: Liver parenchyma is diffusely echogenic with posterior
acoustic attenuation, compatible with diffuse hepatic steatosis. No
definite liver surface irregularity. No liver masses, noting
decreased sensitivity in the setting of an echogenic liver. Portal
vein is patent on color Doppler imaging with normal direction of
blood flow towards the liver.

IVC: No abnormality visualized.

Pancreas: Visualized portion unremarkable.

Spleen: Size and appearance within normal limits.

Right Kidney: Length: 11.5 cm. Echogenicity within normal limits. No
mass or hydronephrosis visualized.

Left Kidney: Length: 10.7 cm. Echogenicity within normal limits. No
mass or hydronephrosis visualized.

Abdominal aorta: No aneurysm visualized.

Other findings: None.
IMPRESSION: 1. Diffuse hepatic steatosis.
2. Gallbladder sludge.  No cholelithiasis.
3. Top normal caliber common bile, 6 mm diameter.

## 2021-06-13 IMAGING — MG DIGITAL SCREENING BILAT W/ TOMO W/ CAD
8 series · 8 of 24 positions shown · non-contrast
Comparison: Previous exam(s).

CLINICAL DATA: Screening.

EXAM:
DIGITAL SCREENING BILATERAL MAMMOGRAM WITH TOMO AND CAD

[R MLO synth-2D]
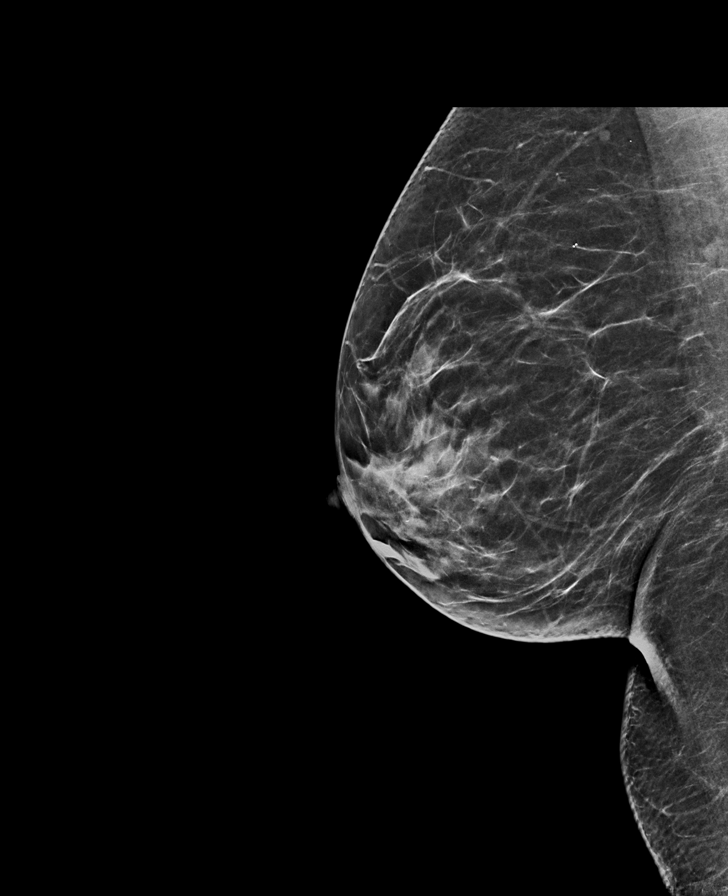

[L CC synth-2D]
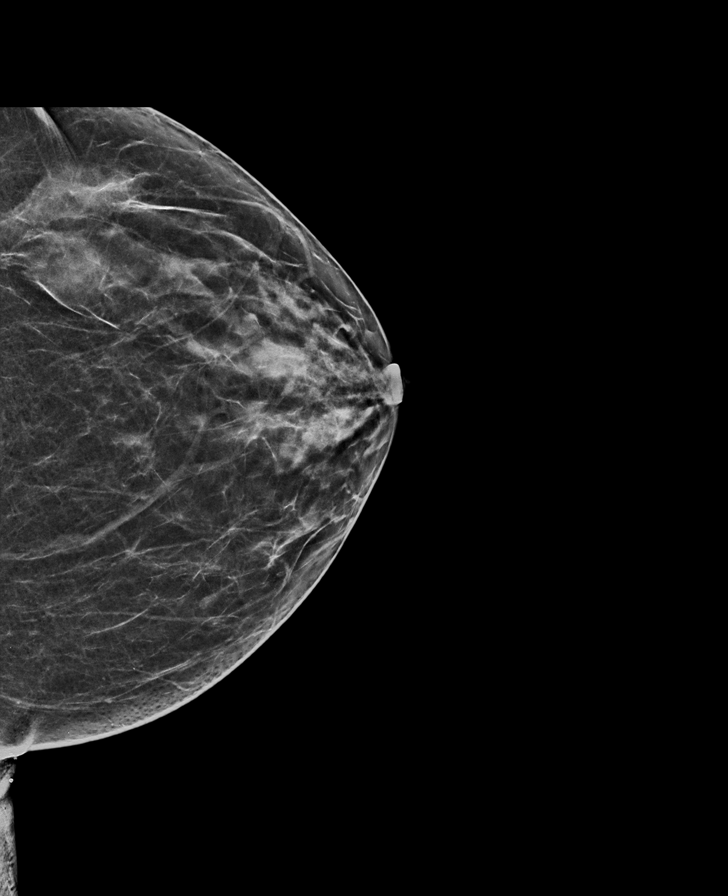

[R CC synth-2D]
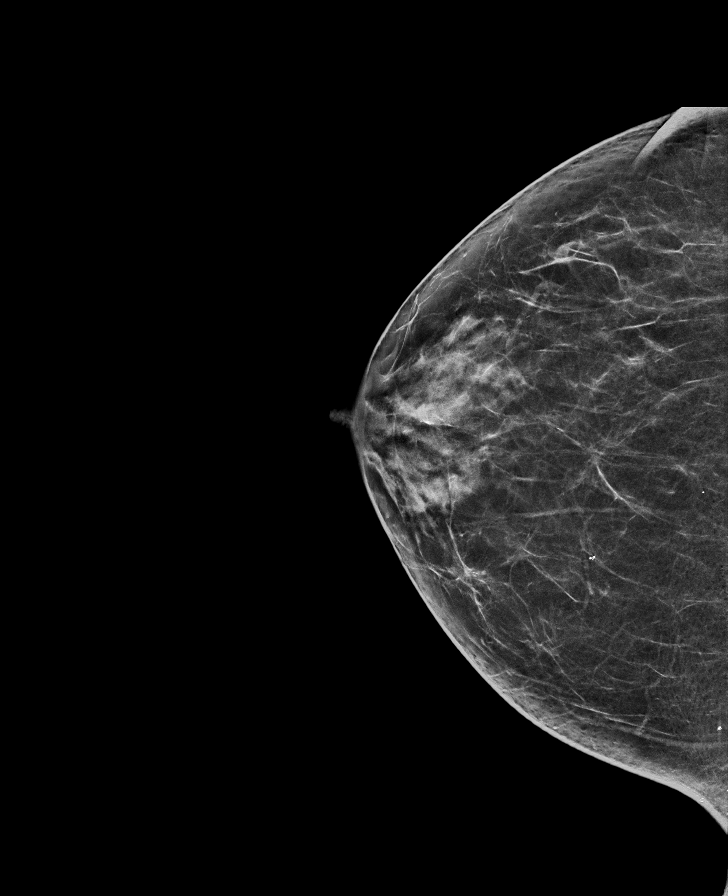

[L MLO synth-2D]
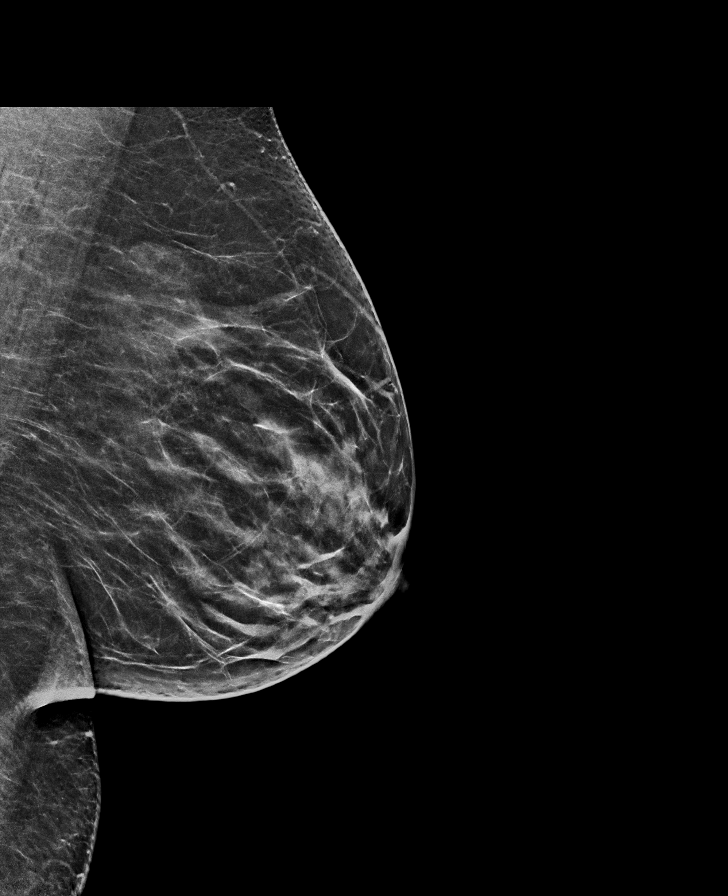

[R MLO tomo · tomo slice 34/67.0]
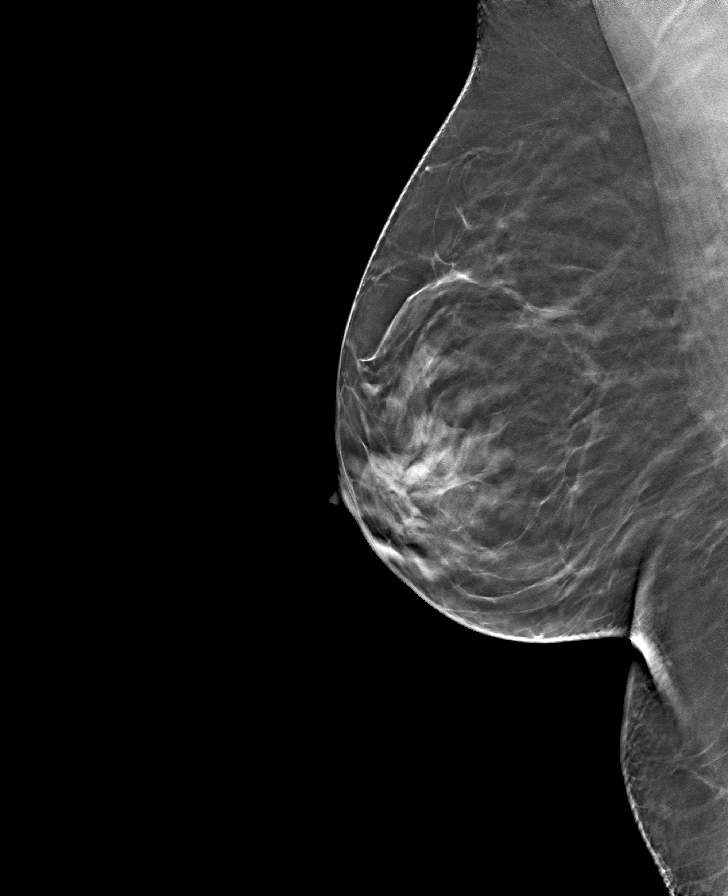

[R CC tomo · tomo slice 33/64.0]
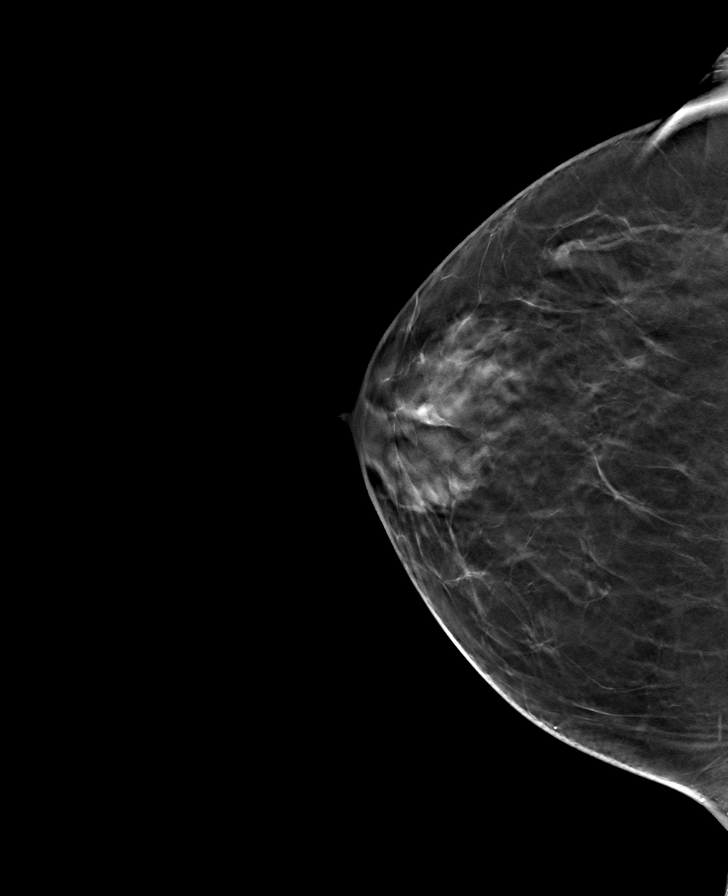

[L CC tomo · tomo slice 32/63.0]
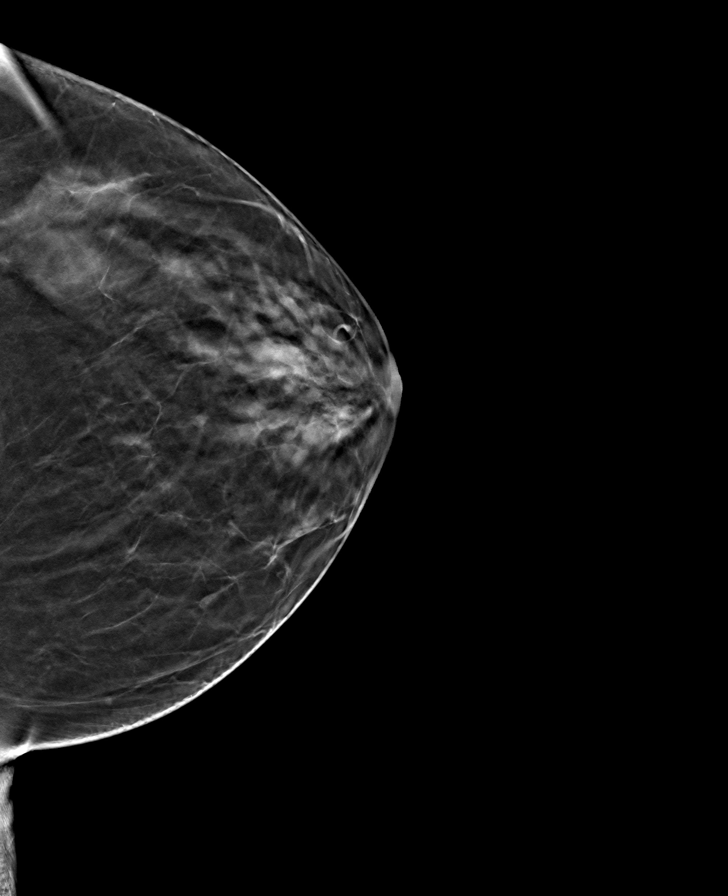

[L MLO tomo · tomo slice 32/63.0]
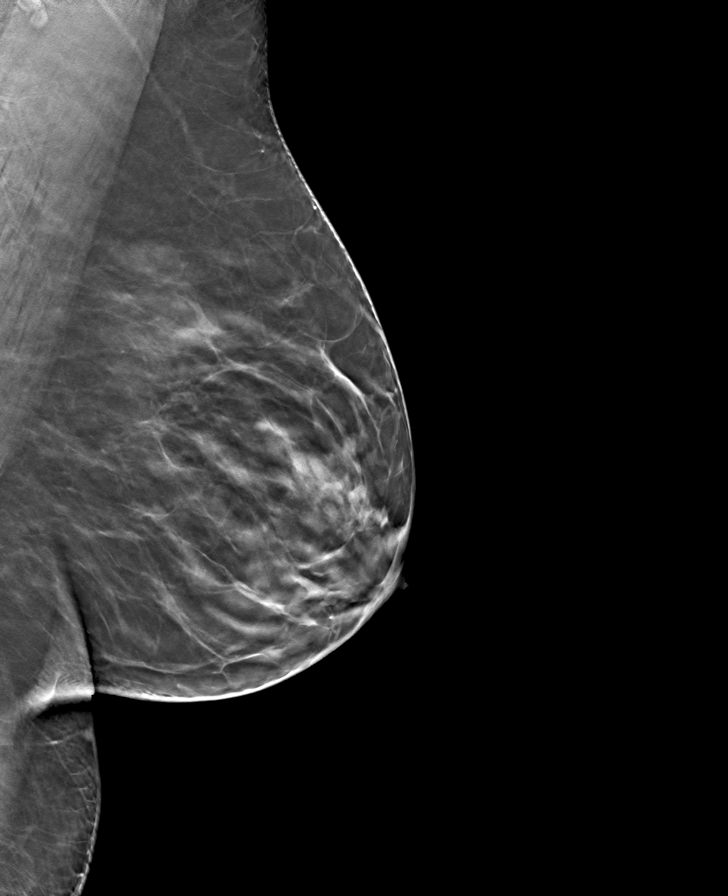

[8 of 24 positions shown; findings below may reference images not displayed]

ACR Breast Density Category b: There are scattered areas of
fibroglandular density.
FINDINGS: There are no findings suspicious for malignancy. Images were
processed with CAD.
IMPRESSION: No mammographic evidence of malignancy. A result letter of this
screening mammogram will be mailed directly to the patient.

RECOMMENDATION:
Screening mammogram in one year. (Code:CN-U-775)

BI-RADS CATEGORY  1: Negative.

## 2021-07-17 ENCOUNTER — Other Ambulatory Visit: Payer: Self-pay | Admitting: Adult Health

## 2021-08-26 ENCOUNTER — Other Ambulatory Visit: Payer: Self-pay | Admitting: Adult Health

## 2021-09-15 ENCOUNTER — Other Ambulatory Visit: Payer: Self-pay | Admitting: Women's Health

## 2021-09-17 NOTE — Telephone Encounter (Signed)
Pt is out of this medication, can someone order a refill    [facility] in [place]  Please advise & notify pt

## 2021-11-17 ENCOUNTER — Other Ambulatory Visit: Payer: Self-pay | Admitting: Adult Health

## 2022-01-19 ENCOUNTER — Other Ambulatory Visit: Payer: Self-pay | Admitting: Adult Health

## 2022-02-26 ENCOUNTER — Other Ambulatory Visit (HOSPITAL_COMMUNITY): Payer: Self-pay | Admitting: Adult Health

## 2022-02-26 DIAGNOSIS — Z1231 Encounter for screening mammogram for malignant neoplasm of breast: Secondary | ICD-10-CM

## 2022-03-06 ENCOUNTER — Emergency Department (HOSPITAL_COMMUNITY)
Admission: EM | Admit: 2022-03-06 | Discharge: 2022-03-06 | Disposition: A | Discharge status: Home / Self Care | Payer: 59 | Attending: Emergency Medicine | Admitting: Emergency Medicine

## 2022-03-06 ENCOUNTER — Encounter (HOSPITAL_COMMUNITY): Payer: Self-pay | Admitting: Emergency Medicine

## 2022-03-06 ENCOUNTER — Other Ambulatory Visit: Payer: Self-pay

## 2022-03-06 ENCOUNTER — Emergency Department (HOSPITAL_COMMUNITY): Payer: 59

## 2022-03-06 DIAGNOSIS — E876 Hypokalemia: Secondary | ICD-10-CM | POA: Insufficient documentation

## 2022-03-06 DIAGNOSIS — R509 Fever, unspecified: Secondary | ICD-10-CM | POA: Insufficient documentation

## 2022-03-06 DIAGNOSIS — R112 Nausea with vomiting, unspecified: Secondary | ICD-10-CM | POA: Insufficient documentation

## 2022-03-06 DIAGNOSIS — R109 Unspecified abdominal pain: Secondary | ICD-10-CM | POA: Diagnosis not present

## 2022-03-06 LAB — CBC
HCT: 39.5 % (ref 36.0–46.0)
Hemoglobin: 14.2 g/dL (ref 12.0–15.0)
MCH: 32.1 pg (ref 26.0–34.0)
MCHC: 35.9 g/dL (ref 30.0–36.0)
MCV: 89.4 fL (ref 80.0–100.0)
Platelets: 215 10*3/uL (ref 150–400)
RBC: 4.42 MIL/uL (ref 3.87–5.11)
RDW: 13 % (ref 11.5–15.5)
WBC: 7.8 10*3/uL (ref 4.0–10.5)
nRBC: 0 % (ref 0.0–0.2)

## 2022-03-06 LAB — URINALYSIS, ROUTINE W REFLEX MICROSCOPIC
Bilirubin Urine: NEGATIVE
Glucose, UA: NEGATIVE mg/dL
Hgb urine dipstick: NEGATIVE
Ketones, ur: 20 mg/dL — AB
Leukocytes,Ua: NEGATIVE
Nitrite: NEGATIVE
Protein, ur: NEGATIVE mg/dL
Specific Gravity, Urine: 1.02 (ref 1.005–1.030)
pH: 7 (ref 5.0–8.0)

## 2022-03-06 LAB — COMPREHENSIVE METABOLIC PANEL
ALT: 146 U/L — ABNORMAL HIGH (ref 0–44)
AST: 163 U/L — ABNORMAL HIGH (ref 15–41)
Albumin: 4.1 g/dL (ref 3.5–5.0)
Alkaline Phosphatase: 96 U/L (ref 38–126)
Anion gap: 9 (ref 5–15)
BUN: 11 mg/dL (ref 6–20)
CO2: 30 mmol/L (ref 22–32)
Calcium: 9 mg/dL (ref 8.9–10.3)
Chloride: 100 mmol/L (ref 98–111)
Creatinine, Ser: 0.92 mg/dL (ref 0.44–1.00)
GFR, Estimated: >60 mL/min (ref >60)
Glucose, Bld: 173 mg/dL — ABNORMAL HIGH (ref 70–99)
Potassium: 3 mmol/L — ABNORMAL LOW (ref 3.5–5.1)
Sodium: 139 mmol/L (ref 135–145)
Total Bilirubin: 0.9 mg/dL (ref 0.3–1.2)
Total Protein: 6.6 g/dL (ref 6.5–8.1)

## 2022-03-06 LAB — LIPASE, BLOOD: Lipase: 29 U/L (ref 11–51)

## 2022-03-06 MED ORDER — IOHEXOL 300 MG/ML  SOLN
100.0000 mL | Freq: Once | INTRAMUSCULAR | Status: AC | PRN
  Administered 2022-03-06: 100 mL via INTRAVENOUS

## 2022-03-06 MED ORDER — POTASSIUM CHLORIDE CRYS ER 20 MEQ PO TBCR
40.0000 meq | EXTENDED_RELEASE_TABLET | Freq: Once | ORAL | Status: AC
Start: 2022-03-06 — End: 2022-03-06
  Administered 2022-03-06: 40 meq via ORAL
  Filled 2022-03-06: qty 2

## 2022-03-06 MED ORDER — MORPHINE SULFATE (PF) 4 MG/ML IV SOLN
4.0000 mg | Freq: Once | INTRAVENOUS | Status: AC
  Administered 2022-03-06: 4 mg via INTRAVENOUS
  Filled 2022-03-06: qty 1

## 2022-03-06 NOTE — ED Provider Notes (Signed)
?[facility] EMERGENCY DEPARTMENT ?Provider Note ? ? ?CSN: [id number] ?Arrival date & time: 03/06/22  1356 ? ?  ? ?History ? ?Chief Complaint  ?Patient presents with  ? Flank Pain  ? Abdominal Pain  ? ? ?Alicia Gross is a 56 y.o. female. ? ? ?Flank Pain ?Associated symptoms include abdominal pain.  ?Abdominal Pain ? ?Patient is a 56 year old female presents to the emergency department for left flank pain for the past week.  She reports she was seen by her PCP and they obtained a CT Noncon due to concern for kidney stone.  Her CT scan did not show any acute finding.  She was started on ciprofloxacin for possible UTI versus pyelonephritis.  She reports she has been taking the Cipro however her pain continued to worsen.  Her doctor then changed her antibiotic to cefdinir and patient continued to have worsening pain.  She reports she has tried oxycodone 10 mg without pain improvement.  She also reports chronic back pain around her lumbar and thoracic region.  She does have associated nausea and report multiple episode of emesis.  She does report a fever of 100.2 at home after taking Tylenol.  Denies dysuria or vaginal discharge.  Denies diarrhea.  Denies chest pain or shortness of breath.  She is here because she cannot tolerate the pain.  Otherwise no other complaints. ? ?Home Medications ?Prior to Admission medications   ?Medication Sig Start Date End Date Taking? Authorizing Provider  ?ALPRAZolam (XANAX) 1 MG tablet TAKE 1/2 TO 1 TABLET BY MOUTH EVERY 8 HOURS AS NEEDED FOR ANXIETY 01/19/22   Adline Potter, NP  ?ascorbic acid (VITAMIN C) 500 MG tablet Take 500 mg by mouth daily.     [provider]  ?cetirizine (ZYRTEC) 10 MG tablet Take 10 mg by mouth.    [provider]  ?Cholecalciferol (VITAMIN D3) 50 MCG (2000 UT) capsule Take 2,000 Units by mouth daily.     [provider]  ?cyclobenzaprine (FLEXERIL) 10 MG tablet Take 10 mg by mouth 3 (three) times daily as  needed for muscle spasms.     [provider]  ?diphenhydramine-acetaminophen (TYLENOL PM) 25-500 MG TABS tablet Take 1 tablet by mouth at bedtime as needed (sleep).    [provider]  ?escitalopram (LEXAPRO) 20 MG tablet TAKE ONE TABLET BY MOUTH DAILY 07/17/21   Cyril Mourning A, NP  ?gabapentin (NEURONTIN) 100 MG capsule Take 100-200 mg by mouth at bedtime.    [provider]  ?hydrochlorothiazide (HYDRODIURIL) 50 MG tablet TAKE ONE TABLET BY MOUTH ONCE DAILY 09/17/21   Adline Potter, NP  ?HYDROcodone-acetaminophen (NORCO/VICODIN) 5-325 MG tablet Take 1 tablet by mouth every 6 (six) hours as needed. 03/18/21   [provider]  ?Magnesium 500 MG TABS Take 500 mg by mouth daily.     [provider]  ?Multiple Vitamin (MULTIVITAMIN) tablet Take 1 tablet by mouth daily.    [provider]  ?Omega-3 Fatty Acids (FISH OIL) 1000 MG CAPS Take 2,000 mg by mouth daily.    [provider]  ?   ? ?Allergies    ?Patient has no known allergies.   ? ?Review of Systems   ?Review of Systems  ?Gastrointestinal:  Positive for abdominal pain.  ?Genitourinary:  Positive for flank pain.  ? ?Physical Exam ?Updated Vital Signs ?BP 130/80 (BP Location: Right Arm)   Pulse 90   Temp 99 ?F (37.2 ?C) (Oral)   Resp 18  SpO2 99%  ?Physical Exam ?Vitals and nursing note reviewed.  ?Constitutional:   ?   General: She is not in acute distress. ?   Appearance: She is well-developed.  ?HENT:  ?   Head: Normocephalic and atraumatic.  ?Eyes:  ?   Conjunctiva/sclera: Conjunctivae normal.  ?Cardiovascular:  ?   Rate and Rhythm: Normal rate and regular rhythm.  ?   Heart sounds: No murmur heard. ?Pulmonary:  ?   Effort: Pulmonary effort is normal. No respiratory distress.  ?   Breath sounds: Normal breath sounds.  ?Abdominal:  ?   General: There is no distension.  ?   Palpations: Abdomen is soft.  ?   Tenderness: There is no abdominal tenderness. There is left CVA tenderness.  There is no guarding or rebound.  ?   Comments: Very minimal left CVA tenderness.  ?Musculoskeletal:     ?   General: No swelling.  ?   Cervical back: Neck supple.  ?Skin: ?   General: Skin is warm and dry.  ?   Capillary Refill: Capillary refill takes less than 2 seconds.  ?Neurological:  ?   General: No focal deficit present.  ?   Mental Status: She is alert and oriented to person, place, and time.  ?   Comments: No midline bony tenderness around the spine region  ?Psychiatric:     ?   Mood and Affect: Mood normal.  ? ? ?ED Results / Procedures / Treatments   ?Labs ?(all labs ordered are listed, but only abnormal results are displayed) ?Labs Reviewed  ?COMPREHENSIVE METABOLIC PANEL - Abnormal; Notable for the following components:  ?    Result Value  ? Potassium 3.0 (*)   ? Glucose, Bld 173 (*)   ? AST 163 (*)   ? ALT 146 (*)   ? All other components within normal limits  ?URINALYSIS, ROUTINE W REFLEX MICROSCOPIC - Abnormal; Notable for the following components:  ? Ketones, ur 20 (*)   ? All other components within normal limits  ?LIPASE, BLOOD  ?CBC  ? ? ?EKG ?None ? ?Radiology ?CT ABDOMEN PELVIS W CONTRAST ? ?Result Date: 03/06/2022 ?CLINICAL DATA:  56 year old female with acute LEFT abdominal, flank and pelvic pain. EXAM: CT ABDOMEN AND PELVIS WITH CONTRAST TECHNIQUE: Multidetector CT imaging of the abdomen and pelvis was performed using the standard protocol following bolus administration of intravenous contrast. RADIATION DOSE REDUCTION: This exam was performed according to the departmental dose-optimization program which includes automated exposure control, adjustment of the mA and/or kV according to patient size and/or use of iterative reconstruction technique. CONTRAST:  100mL OMNIPAQUE IOHEXOL 300 MG/ML  SOLN COMPARISON:  03/02/2022 CT and prior studies FINDINGS: Lower chest: No acute abnormality. Hepatobiliary: Severe hepatic steatosis again identified without focal hepatic abnormality. Patient is status  post cholecystectomy. There is no evidence of intrahepatic or extrahepatic biliary dilatation. Pancreas: Unremarkable Spleen: Unremarkable Adrenals/Urinary Tract: The kidneys, adrenal glands and bladder are unremarkable except for a stable 2 cm LEFT adrenal myelolipoma. Stomach/Bowel: Stomach is within normal limits. Appendix appears normal. No evidence of bowel wall thickening, distention, or inflammatory changes. Vascular/Lymphatic: No significant vascular findings are present. No enlarged abdominal or pelvic lymph nodes. Reproductive: Status post hysterectomy. No adnexal masses. Other: No ascites, focal collection or pneumoperitoneum. Musculoskeletal: No acute or suspicious bony abnormalities are identified. Degenerative disc disease, facet arthropathy at L4-5 and L5-S1 noted with degenerative disc disease at L5-S1. Some degree of LEFT foraminal narrowing at L5-S1 is present. IMPRESSION: 1. No  evidence of acute abnormality. 2. Severe hepatic steatosis. 3. Lower lumbar degenerative changes, greatest in the LOWER lumbar spine with some degree of LEFT foraminal narrowing at L5-S1. Electronically Signed   By: Harmon Pier M.D.   On: 03/06/2022 18:08   ? ?Procedures ?Procedures  ? ?Medications Ordered in ED ?Medications  ?potassium chloride SA (KLOR-CON M) CR tablet 40 mEq (40 mEq Oral Given 03/06/22 1651)  ?morphine (PF) 4 MG/ML injection 4 mg (4 mg Intravenous Given 03/06/22 1707)  ?iohexol (OMNIPAQUE) 300 MG/ML solution 100 mL (100 mLs Intravenous Contrast Given 03/06/22 1758)  ? ? ?ED Course/ Medical Decision Making/ A&P ?Clinical Course as of 03/06/22 1858  ?Sat Mar 06, 2022  ?1808 He is here with left flank pain left lower quadrant pain its been going on for about 5 days.  No trauma.  She has been on antibiotics for possible urinary infection.  It sounds like the urine culture grew out Enterococcus faecalis.  Continues to be in pain despite pain medication.  Getting labs pain medication and CT with contrast.   Disposition per results of testing. [MB]  ?  ?Clinical Course User Index ?[MB] Terrilee Files, MD  ? ?                        ?Medical Decision Making ?Problems Addressed: ?Left flank pain: acute illness or injury that

## 2022-03-06 NOTE — Discharge Instructions (Signed)
You have been evaluated for left flank pain.  Your CT study did not show any acute finding.  You do have fatty liver disease and your liver enzymes are elevated.  Please follow-up with your primary care provider.  Continue to take ibuprofen and Tylenol for pain management.  Continue taking your antibiotic as instructed by your primary care provider. ?

## 2022-03-06 NOTE — ED Triage Notes (Signed)
Pt reports L flank pain that radiates to L abd since Monday.  Seen at PCP office on Tuesday and had CT that was negative for kidney stones.  UA suggestive of UTI- started on Cipro and then changed to [facility].  States she is taking Oxycodone 10mg  for pain.  Seen at [facility] yesterday for same. Continues to have severe pain, nausea, and vomiting.  Reports low grade fevers. ?

## 2022-03-06 NOTE — ED Notes (Signed)
Discharge instructions reviewed with patient. Patient verbalized understanding of instructions. Follow-up care and medications were reviewed. Patient ambulatory with steady gait. VSS upon discharge.  ?

## 2022-03-20 ENCOUNTER — Other Ambulatory Visit: Payer: Self-pay | Admitting: Adult Health

## 2022-04-26 ENCOUNTER — Ambulatory Visit (HOSPITAL_COMMUNITY)
Admission: RE | Admit: 2022-04-26 | Discharge: 2022-04-26 | Disposition: A | Discharge status: Home / Self Care | Payer: 59 | Source: Ambulatory Visit | Attending: Adult Health | Admitting: Adult Health

## 2022-04-26 DIAGNOSIS — Z1231 Encounter for screening mammogram for malignant neoplasm of breast: Secondary | ICD-10-CM | POA: Insufficient documentation

## 2022-04-27 ENCOUNTER — Other Ambulatory Visit: Payer: 59 | Admitting: Adult Health

## 2022-05-05 ENCOUNTER — Ambulatory Visit (INDEPENDENT_AMBULATORY_CARE_PROVIDER_SITE_OTHER): Payer: 59 | Admitting: Adult Health

## 2022-05-05 ENCOUNTER — Encounter: Payer: Self-pay | Admitting: Adult Health

## 2022-05-05 VITALS — BP 110/76 | HR 92 | Ht 66.0 in | Wt 221.5 lb

## 2022-05-05 DIAGNOSIS — F419 Anxiety disorder, unspecified: Secondary | ICD-10-CM

## 2022-05-05 DIAGNOSIS — F32A Depression, unspecified: Secondary | ICD-10-CM

## 2022-05-05 DIAGNOSIS — Z01419 Encounter for gynecological examination (general) (routine) without abnormal findings: Secondary | ICD-10-CM

## 2022-05-05 DIAGNOSIS — Z90710 Acquired absence of both cervix and uterus: Secondary | ICD-10-CM

## 2022-05-05 DIAGNOSIS — Z1211 Encounter for screening for malignant neoplasm of colon: Secondary | ICD-10-CM

## 2022-05-05 LAB — HEMOCCULT GUIAC POC 1CARD (OFFICE): Fecal Occult Blood, POC: NEGATIVE

## 2022-05-05 NOTE — Progress Notes (Signed)
Patient ID: Alicia Gross, female   DOB: Sep 16, 1966, 56 y.o.   MRN: [id number] History of Present Illness: Alicia Gross is a 56 year old white female,married, sp hysterectomy in for a well woman gyn exam. She works from home for [employer]. PCP is [facility].    Current Medications, Allergies, Past Medical History, Past Surgical History, Family History and Social History were reviewed in [employer] record.     Review of Systems: Patient denies any headaches, hearing loss, fatigue, blurred vision, shortness of breath, chest pain, abdominal pain, problems with bowel movements(has IBS), urination, or intercourse. No joint pain or mood swings. She has had back pain with 1 episode of losing bladder control, has had MRI    Physical Exam:BP 110/76 (BP Location: Left Arm, Patient Position: Sitting, Cuff Size: Large)   Pulse 92   Ht 5\' 6"  (1.676 m)   Wt 221 lb 8 oz (100.5 kg)   BMI 35.75 kg/m   General:  Well developed, well nourished, no acute distress Skin:  Warm and dry Neck:  Midline trachea, normal thyroid, good ROM, no lymphadenopathy Lungs; Clear to auscultation bilaterally Breast:  No dominant palpable mass, retraction, or nipple discharge Cardiovascular: Regular rate and rhythm Abdomen:  Soft, non tender, no hepatosplenomegaly Pelvic:  External genitalia is normal in appearance, no lesions.  The vagina is normal in appearance. Urethra has no lesions or masses. The cervix and uterus are absent.  No adnexal masses or tenderness noted.Bladder is non tender, no masses felt. Rectal: Good sphincter tone, no polyps, + hemorrhoids felt.  Hemoccult negative. Extremities/musculoskeletal:  No swelling or varicosities noted, no clubbing or cyanosis Psych:  No mood changes, alert and cooperative,seems happy AA is 1 Fall risk is moderate    05/05/2022    8:31 AM 04/17/2021    8:41 AM 01/16/2020   11:29 AM  Depression screen PHQ 2/9  Decreased Interest 0 0 0  Down, Depressed, Hopeless  0 0 0  PHQ - 2 Score 0 0 0  Altered sleeping 1 0   Tired, decreased energy 2 1   Change in appetite 1 1   Feeling bad or failure about yourself  0 0   Trouble concentrating 0 0   Moving slowly or fidgety/restless 0 0   Suicidal thoughts 0 0   PHQ-9 Score 4 2        05/05/2022    8:32 AM 04/17/2021    8:42 AM  GAD 7 : Generalized Anxiety Score  Nervous, Anxious, on Edge 1 0  Control/stop worrying 0 0  Worry too much - different things 0 0  Trouble relaxing 0 0  Restless 0 0  Easily annoyed or irritable 0 0  Afraid - awful might happen 0 0  Total GAD 7 Score 1 0    Upstream - 05/05/22 0839       Pregnancy Intention Screening   Does the patient want to become pregnant in the next year? N/A    Does the patient's partner want to become pregnant in the next year? N/A    Would the patient like to discuss contraceptive options today? N/A      Contraception Wrap Up   Current Method Female Sterilization   hyst   End Method Female Sterilization   hyst   Contraception Counseling Provided No              Examination chaperoned by 07/06/22 LPN  Impression and Plan: 1. Encounter for well woman exam with  routine gynecological exam Physical in 1 year Mammogram was normal 04/26/22 Labs with PCP, reviewed, will scan in Colonoscopy per GI She got Tdap,shingles vaccine  and pneumonia shot 04/20/22 and second shingles vaccine today  2. Encounter for screening fecal occult blood testing Hemoccult was negative   3. S/P hysterectomy  4. Anxiety and depression Has refills on lexapro and xanax

## 2022-05-24 ENCOUNTER — Other Ambulatory Visit: Payer: Self-pay | Admitting: Adult Health

## 2022-07-20 ENCOUNTER — Other Ambulatory Visit: Payer: Self-pay | Admitting: Adult Health

## 2022-08-24 ENCOUNTER — Other Ambulatory Visit: Payer: Self-pay | Admitting: Adult Health

## 2022-09-20 ENCOUNTER — Other Ambulatory Visit: Payer: Self-pay | Admitting: Adult Health

## 2022-11-12 ENCOUNTER — Other Ambulatory Visit: Payer: Self-pay | Admitting: Adult Health

## 2022-12-23 ENCOUNTER — Other Ambulatory Visit: Payer: Self-pay | Admitting: Adult Health

## 2023-02-22 ENCOUNTER — Other Ambulatory Visit: Payer: Self-pay | Admitting: Adult Health

## 2023-03-24 ENCOUNTER — Other Ambulatory Visit: Payer: Self-pay | Admitting: Adult Health

## 2023-03-24 MED ORDER — ALPRAZOLAM 1 MG PO TABS: ORAL_TABLET | ORAL | 0 refills | Status: DC

## 2023-03-24 NOTE — Progress Notes (Signed)
Refilled xanax to [facility]. [facility] closed

## 2023-04-05 ENCOUNTER — Other Ambulatory Visit: Payer: Self-pay | Admitting: Adult Health

## 2023-04-26 ENCOUNTER — Other Ambulatory Visit (HOSPITAL_COMMUNITY): Payer: Self-pay | Admitting: Adult Health

## 2023-04-26 DIAGNOSIS — Z1231 Encounter for screening mammogram for malignant neoplasm of breast: Secondary | ICD-10-CM

## 2023-06-01 ENCOUNTER — Encounter (HOSPITAL_COMMUNITY): Payer: Self-pay

## 2023-06-01 ENCOUNTER — Ambulatory Visit (HOSPITAL_COMMUNITY)
Admission: RE | Admit: 2023-06-01 | Discharge: 2023-06-01 | Disposition: A | Discharge status: Home / Self Care | Payer: 59 | Source: Ambulatory Visit | Attending: Adult Health | Admitting: Adult Health

## 2023-06-01 DIAGNOSIS — Z1231 Encounter for screening mammogram for malignant neoplasm of breast: Secondary | ICD-10-CM | POA: Insufficient documentation

## 2023-06-15 ENCOUNTER — Encounter: Payer: Self-pay | Admitting: Adult Health

## 2023-06-15 ENCOUNTER — Ambulatory Visit (INDEPENDENT_AMBULATORY_CARE_PROVIDER_SITE_OTHER): Payer: 59 | Admitting: Adult Health

## 2023-06-15 VITALS — BP 120/82 | HR 94 | Ht 66.75 in | Wt 215.5 lb

## 2023-06-15 DIAGNOSIS — L659 Nonscarring hair loss, unspecified: Secondary | ICD-10-CM

## 2023-06-15 DIAGNOSIS — Z13220 Encounter for screening for lipoid disorders: Secondary | ICD-10-CM

## 2023-06-15 DIAGNOSIS — F32A Depression, unspecified: Secondary | ICD-10-CM

## 2023-06-15 DIAGNOSIS — Z01419 Encounter for gynecological examination (general) (routine) without abnormal findings: Secondary | ICD-10-CM | POA: Diagnosis not present

## 2023-06-15 DIAGNOSIS — I10 Essential (primary) hypertension: Secondary | ICD-10-CM

## 2023-06-15 DIAGNOSIS — Z90710 Acquired absence of both cervix and uterus: Secondary | ICD-10-CM

## 2023-06-15 DIAGNOSIS — F419 Anxiety disorder, unspecified: Secondary | ICD-10-CM

## 2023-06-15 DIAGNOSIS — Z1211 Encounter for screening for malignant neoplasm of colon: Secondary | ICD-10-CM | POA: Diagnosis not present

## 2023-06-15 LAB — HEMOCCULT GUIAC POC 1CARD (OFFICE): Fecal Occult Blood, POC: NEGATIVE

## 2023-06-15 MED ORDER — ONDANSETRON 4 MG PO TBDP: 4.0000 mg | ORAL_TABLET | Freq: Three times a day (TID) | ORAL | 1 refills | Status: AC | PRN

## 2023-06-15 MED ORDER — SCOPOLAMINE 1 MG/3DAYS TD PT72: MEDICATED_PATCH | TRANSDERMAL | 0 refills | Status: DC

## 2023-06-15 MED ORDER — ESCITALOPRAM OXALATE 20 MG PO TABS: 20.0000 mg | ORAL_TABLET | Freq: Every day | ORAL | 3 refills | Status: DC

## 2023-06-15 NOTE — Progress Notes (Signed)
Patient ID: Alicia Gross, female   DOB: 11/05/1965, 57 y.o.   MRN: [id number] History of Present Illness: Alicia Gross is a 57 year year old white female, married, sp hysterectomy in for a well woman gyn exam. She is going on a cruise to [place] for 10 days and requests rx for transderm scop patch and zofran.  She wants labs too.   PCP is Dr Mayford Knife   Current Medications, Allergies, Past Medical History, Past Surgical History, Family History and Social History were reviewed in [employer] electronic medical record.     Review of Systems: Patient denies any headaches, hearing loss, fatigue, blurred vision, shortness of breath, chest pain, abdominal pain, problems with bowel movements, urination, or intercourse. No joint pain or mood swings.  Not sleeping well, has TMJ and grinds teeth, has gotten mouth guard Hair is thinning    Physical Exam:BP 120/82 (BP Location: Left Arm, Patient Position: Sitting, Cuff Size: Normal)   Pulse 94   Ht 5' 6.75" (1.695 m)   Wt 215 lb 8 oz (97.8 kg)   BMI 34.01 kg/m   General:  Well developed, well nourished, no acute distress Skin:  Warm and dry Neck:  Midline trachea, normal thyroid, good ROM, no lymphadenopathy Lungs; Clear to auscultation bilaterally Breast:  No dominant palpable mass, retraction, or nipple discharge Cardiovascular: Regular rate and rhythm Abdomen:  Soft, non tender, no hepatosplenomegaly Pelvic:  External genitalia is normal in appearance, no lesions.  The vagina is pale. Urethra has no lesions or masses. The cervix and uterus are absent.   No adnexal masses or tenderness noted.Bladder is non tender, no masses felt. Rectal: Good sphincter tone, no polyps, or hemorrhoids felt.  Hemoccult negative. Extremities/musculoskeletal:  No swelling or varicosities noted, no clubbing or cyanosis Psych:  No mood changes, alert and cooperative,seems happy AA is 0 Fall risk is low    06/15/2023    2:34 PM 05/05/2022    8:31 AM 04/17/2021    8:41  AM  Depression screen PHQ 2/9  Decreased Interest 0 0 0  Down, Depressed, Hopeless 0 0 0  PHQ - 2 Score 0 0 0  Altered sleeping 1 1 0  Tired, decreased energy 1 2 1   Change in appetite 1 1 1   Feeling bad or failure about yourself  0 0 0  Trouble concentrating 1 0 0  Moving slowly or fidgety/restless 0 0 0  Suicidal thoughts 0 0 0  PHQ-9 Score 4 4 2        06/15/2023    2:34 PM 05/05/2022    8:32 AM 04/17/2021    8:42 AM  GAD 7 : Generalized Anxiety Score  Nervous, Anxious, on Edge 1 1 0  Control/stop worrying 1 0 0  Worry too much - different things 1 0 0  Trouble relaxing 0 0 0  Restless 0 0 0  Easily annoyed or irritable 0 0 0  Afraid - awful might happen 0 0 0  Total GAD 7 Score 3 1 0    Upstream - 06/15/23 1442       Pregnancy Intention Screening   Does the patient want to become pregnant in the next year? N/A    Does the patient's partner want to become pregnant in the next year? N/A    Would the patient like to discuss contraceptive options today? N/A      Contraception Wrap Up   Current Method Female Sterilization   hyst   End Method Female Sterilization  hyst   Contraception Counseling Provided No              Examination chaperoned by Malachy Mood LPN   Impression and Plan: 1. Encounter for well woman exam with routine gynecological exam Physical in 1 year Will check labs Mammogram was negative 06/01/23 Colonoscopy per GI - CBC - Comprehensive metabolic panel - Lipid panel Rx given for transderm scop patch and zofran for cruise  2. Encounter for screening fecal occult blood testing Hemoccult was negative  - POCT occult blood stool  3. S/P hysterectomy  4. Anxiety and depression On lexapro 20 mg 1 daily and xanax prn Will refill lexapro  Meds ordered this encounter  Medications   escitalopram (LEXAPRO) 20 MG tablet    Sig: Take 1 tablet (20 mg total) by mouth daily.    Dispense:  90 tablet    Refill:  3    Order Specific Question:    Supervising Provider    Answer:   Despina Hidden, LUTHER H [[id number]]   ondansetron (ZOFRAN-ODT) 4 MG disintegrating tablet    Sig: Take 1 tablet (4 mg total) by mouth every 8 (eight) hours as needed for nausea or vomiting.    Dispense:  30 tablet    Refill:  1    Order Specific Question:   Supervising Provider    Answer:   Despina Hidden, LUTHER H [[id number]]   scopolamine (TRANSDERM-SCOP) 1 MG/3DAYS    Sig: Apply 24 hours before cruise, can repeat in 3 days if needed    Dispense:  4 patch    Refill:  0    Order Specific Question:   Supervising Provider    Answer:   Despina Hidden, LUTHER H [[id number]]     5. Essential hypertension On hydrodiuril 50 mg ,has refills  - Comprehensive metabolic panel  6. Screening cholesterol level - Lipid panel  7. Hair thinning - TSH + free T4

## 2023-06-16 LAB — CBC
Hematocrit: 44.8 % (ref 34.0–46.6)
Hemoglobin: 15.3 g/dL (ref 11.1–15.9)
MCH: 30.7 pg (ref 26.6–33.0)
MCHC: 34.2 g/dL (ref 31.5–35.7)
MCV: 90 fL (ref 79–97)
Platelets: 254 10*3/uL (ref 150–450)
RBC: 4.99 x10E6/uL (ref 3.77–5.28)
RDW: 12.8 % (ref 11.7–15.4)
WBC: 9.9 10*3/uL (ref 3.4–10.8)

## 2023-06-16 LAB — LIPID PANEL
Chol/HDL Ratio: 5.3 ratio — ABNORMAL HIGH (ref 0.0–4.4)
Cholesterol, Total: 176 mg/dL (ref 100–199)
HDL: 33 mg/dL — ABNORMAL LOW (ref >39)
LDL Chol Calc (NIH): 108 mg/dL — ABNORMAL HIGH (ref 0–99)
Triglycerides: 201 mg/dL — ABNORMAL HIGH (ref 0–149)
VLDL Cholesterol Cal: 35 mg/dL (ref 5–40)

## 2023-06-16 LAB — COMPREHENSIVE METABOLIC PANEL
ALT: 18 IU/L (ref 0–32)
AST: 20 IU/L (ref 0–40)
Albumin: 4.7 g/dL (ref 3.8–4.9)
Alkaline Phosphatase: 113 IU/L (ref 44–121)
BUN/Creatinine Ratio: 19 (ref 9–23)
BUN: 14 mg/dL (ref 6–24)
Bilirubin Total: 0.5 mg/dL (ref 0.0–1.2)
CO2: 28 mmol/L (ref 20–29)
Calcium: 9.9 mg/dL (ref 8.7–10.2)
Chloride: 98 mmol/L (ref 96–106)
Creatinine, Ser: 0.74 mg/dL (ref 0.57–1.00)
Globulin, Total: 2.5 g/dL (ref 1.5–4.5)
Glucose: 98 mg/dL (ref 70–99)
Potassium: 3.4 mmol/L — ABNORMAL LOW (ref 3.5–5.2)
Sodium: 141 mmol/L (ref 134–144)
Total Protein: 7.2 g/dL (ref 6.0–8.5)
eGFR: 93 mL/min/{1.73_m2} (ref >59)

## 2023-06-16 LAB — TSH+FREE T4
Free T4: 1.22 ng/dL (ref 0.82–1.77)
TSH: 1.41 u[IU]/mL (ref 0.450–4.500)

## 2023-06-18 LAB — SPECIMEN STATUS REPORT

## 2023-06-20 ENCOUNTER — Other Ambulatory Visit: Payer: Self-pay | Admitting: Adult Health

## 2023-06-21 LAB — HGB A1C W/O EAG: Hgb A1c MFr Bld: 6.3 % — ABNORMAL HIGH (ref 4.8–5.6)

## 2023-06-21 LAB — SPECIMEN STATUS REPORT

## 2023-06-28 ENCOUNTER — Ambulatory Visit: Payer: 59 | Admitting: Adult Health

## 2023-08-03 ENCOUNTER — Other Ambulatory Visit: Payer: Self-pay | Admitting: Adult Health

## 2023-08-08 ENCOUNTER — Other Ambulatory Visit: Payer: Self-pay | Admitting: Adult Health

## 2023-09-14 ENCOUNTER — Other Ambulatory Visit: Payer: Self-pay | Admitting: Adult Health

## 2023-09-24 ENCOUNTER — Other Ambulatory Visit: Payer: Self-pay | Admitting: Adult Health

## 2023-10-13 ENCOUNTER — Other Ambulatory Visit: Payer: Self-pay | Admitting: Adult Health

## 2023-10-24 ENCOUNTER — Other Ambulatory Visit: Payer: Self-pay | Admitting: Adult Health

## 2023-11-11 ENCOUNTER — Other Ambulatory Visit: Payer: Self-pay | Admitting: Adult Health

## 2023-11-21 ENCOUNTER — Other Ambulatory Visit: Payer: Self-pay | Admitting: Adult Health

## 2023-12-14 ENCOUNTER — Other Ambulatory Visit: Payer: Self-pay | Admitting: Adult Health

## 2023-12-26 ENCOUNTER — Other Ambulatory Visit: Payer: Self-pay | Admitting: Adult Health

## 2024-01-12 ENCOUNTER — Other Ambulatory Visit: Payer: Self-pay | Admitting: Adult Health

## 2024-01-12 ENCOUNTER — Telehealth: Payer: Self-pay | Admitting: *Deleted

## 2024-01-12 NOTE — Telephone Encounter (Signed)
 Pt called in checking status of referral to get colonoscopy scheduled.

## 2024-01-22 ENCOUNTER — Other Ambulatory Visit: Payer: Self-pay | Admitting: Adult Health

## 2024-01-27 ENCOUNTER — Encounter (HOSPITAL_COMMUNITY): Payer: Self-pay

## 2024-01-27 ENCOUNTER — Emergency Department (HOSPITAL_COMMUNITY): Admission: EM | Admit: 2024-01-27 | Discharge: 2024-01-27 | Disposition: A | Discharge status: Home / Self Care

## 2024-01-27 ENCOUNTER — Other Ambulatory Visit: Payer: Self-pay

## 2024-01-27 ENCOUNTER — Emergency Department (HOSPITAL_COMMUNITY)

## 2024-01-27 DIAGNOSIS — S0592XA Unspecified injury of left eye and orbit, initial encounter: Secondary | ICD-10-CM | POA: Diagnosis present

## 2024-01-27 DIAGNOSIS — H43812 Vitreous degeneration, left eye: Secondary | ICD-10-CM | POA: Diagnosis not present

## 2024-01-27 DIAGNOSIS — X58XXXA Exposure to other specified factors, initial encounter: Secondary | ICD-10-CM | POA: Insufficient documentation

## 2024-01-27 DIAGNOSIS — S01112A Laceration without foreign body of left eyelid and periocular area, initial encounter: Secondary | ICD-10-CM | POA: Diagnosis not present

## 2024-01-27 LAB — CBC
HCT: 42.9 % (ref 36.0–46.0)
Hemoglobin: 15 g/dL (ref 12.0–15.0)
MCH: 32.1 pg (ref 26.0–34.0)
MCHC: 35 g/dL (ref 30.0–36.0)
MCV: 91.7 fL (ref 80.0–100.0)
Platelets: 256 10*3/uL (ref 150–400)
RBC: 4.68 MIL/uL (ref 3.87–5.11)
RDW: 12.2 % (ref 11.5–15.5)
WBC: 9.6 10*3/uL (ref 4.0–10.5)
nRBC: 0 % (ref 0.0–0.2)

## 2024-01-27 LAB — BASIC METABOLIC PANEL WITH GFR
Anion gap: 11 (ref 5–15)
BUN: 13 mg/dL (ref 6–20)
CO2: 28 mmol/L (ref 22–32)
Calcium: 9.3 mg/dL (ref 8.9–10.3)
Chloride: 100 mmol/L (ref 98–111)
Creatinine, Ser: 0.6 mg/dL (ref 0.44–1.00)
GFR, Estimated: >60 mL/min (ref >60)
Glucose, Bld: 135 mg/dL — ABNORMAL HIGH (ref 70–99)
Potassium: 3.3 mmol/L — ABNORMAL LOW (ref 3.5–5.1)
Sodium: 139 mmol/L (ref 135–145)

## 2024-01-27 LAB — TROPONIN I (HIGH SENSITIVITY)
Troponin I (High Sensitivity): 4 ng/L (ref <18)
Troponin I (High Sensitivity): 3 ng/L (ref <18)

## 2024-01-27 NOTE — Discharge Instructions (Addendum)
 Please follow-up with the ophthalmologist.  Call if you have any worsening of your vision or you may return immediately felt fevers, chills, sudden onset headache, vision loss, weakness on one side of your body, painful eye movement, worsening chest pain, shortness of breath, feel your heart is racing, passout or develop any new or worsening symptoms that are concerning to you.

## 2024-01-27 NOTE — ED Triage Notes (Signed)
 Pt c/o visual changes in both eyes that started yesterday. Pt started having chest pressure/heaviness today. Pt has had shortness of breath and weakness, denies nausea or vomiting. Pt's sister states pt has had moments of not making sense as well.

## 2024-01-27 NOTE — ED Provider Notes (Signed)
 [place] EMERGENCY DEPARTMENT AT [facility] Provider Note   CSN: [id number] Arrival date & time: 01/27/24  1105     History  Chief Complaint  Patient presents with   Chest Pain    Alicia Gross is a 58 y.o. female.  This is a 58 year old female presenting emergency department with flashes and floaters in her left eye.  Symptoms started abruptly yesterday.  This morning send she is quite anxious and started having some intermittent chest pain described as pressure.  Had similar chest pain several years ago had cardiac workup and told that she likely has anxiety.  Denies headache, facial droop, vision loss, but notes that she has a squiggly line in the periphery of her left eye.  No paresthesia, he unilateral weakness.  No aphasia or difficulty finding words.   Chest Pain      Home Medications Prior to Admission medications   Medication Sig Start Date End Date Taking? Authorizing Provider  albuterol (VENTOLIN HFA) 108 (90 Base) MCG/ACT inhaler SMARTSIG:2 Puff(s) Via Inhaler 4 Times Daily PRN 01/05/22   [provider]  ALPRAZolam Prudy Feeler) 1 MG tablet TAKE 1/2 TO 1 TABLET BY MOUTH EVERY 8 HOURS AS NEEDED FOR ANXIETY 01/12/24   Cyril Mourning A, NP  ascorbic acid (VITAMIN C) 500 MG tablet Take 500 mg by mouth.    [provider]  cetirizine (ZYRTEC) 10 MG tablet Take 10 mg by mouth.    [provider]  Cholecalciferol (VITAMIN D3) 50 MCG (2000 UT) capsule Take 2,000 Units by mouth daily.     [provider]  cyclobenzaprine (FLEXERIL) 10 MG tablet Take 10 mg by mouth 3 (three) times daily as needed for muscle spasms.     [provider]  diphenhydramine-acetaminophen (TYLENOL PM) 25-500 MG TABS tablet Take 1 tablet by mouth at bedtime as needed (sleep).    [provider]  escitalopram (LEXAPRO) 20 MG tablet Take 1 tablet (20 mg total) by mouth daily. 06/15/23   Adline Potter, NP  FLOVENT [facility] 110 MCG/ACT inhaler  SMARTSIG:By Mouth 01/19/22   [provider]  gabapentin (NEURONTIN) 300 MG capsule Take 300 mg by mouth 3 (three) times daily. 04/29/22   [provider]  hydrochlorothiazide (HYDRODIURIL) 50 MG tablet TAKE 1 TABLET BY MOUTH ONCE DAILY 08/03/23   Cyril Mourning A, NP  HYDROcodone-acetaminophen (NORCO/VICODIN) 5-325 MG tablet Take 1 tablet by mouth every 6 (six) hours as needed. 03/18/21   [provider]  Magnesium 500 MG TABS Take 500 mg by mouth daily.     [provider]  Multiple Vitamin (MULTIVITAMIN) tablet Take 1 tablet by mouth daily.    [provider]  ondansetron (ZOFRAN-ODT) 4 MG disintegrating tablet Take 1 tablet (4 mg total) by mouth every 8 (eight) hours as needed for nausea or vomiting. 06/15/23   Adline Potter, NP  Red Yeast Rice Extract (RED YEAST RICE PO) Take by mouth.    [provider]  scopolamine (TRANSDERM-SCOP) 1 MG/3DAYS Apply 24 hours before cruise, can repeat in 3 days if needed 06/15/23   Adline Potter, NP      Allergies    Patient has no known allergies.    Review of Systems   Review of Systems  Cardiovascular:  Positive for chest pain.    Physical Exam Updated Vital Signs BP 127/81   Pulse 76   Temp 98.1 F (36.7 C) (Oral)   Resp 14   Ht 5\' 7"  (1.702  m)   Wt 99.3 kg   SpO2 100%   BMI 34.30 kg/m  Physical Exam Vitals and nursing note reviewed.  Constitutional:      General: She is not in acute distress.    Appearance: She is not toxic-appearing.  HENT:     Head: Normocephalic and atraumatic.  Cardiovascular:     Rate and Rhythm: Normal rate and regular rhythm.     Heart sounds: Normal heart sounds.  Pulmonary:     Breath sounds: Normal breath sounds.  Abdominal:     Palpations: Abdomen is soft.  Musculoskeletal:        General: Normal range of motion.     Cervical back: Normal range of motion.  Skin:    General: Skin is warm and dry.     Capillary Refill: Capillary refill  takes less than 2 seconds.  Neurological:     Mental Status: She is alert and oriented to person, place, and time.  Psychiatric:        Mood and Affect: Mood normal.        Behavior: Behavior normal.     ED Results / Procedures / Treatments   Labs (all labs ordered are listed, but only abnormal results are displayed) Labs Reviewed  BASIC METABOLIC PANEL WITH GFR - Abnormal; Notable for the following components:      Result Value   Potassium 3.3 (*)    Glucose, Bld 135 (*)    All other components within normal limits  CBC  TROPONIN I (HIGH SENSITIVITY)  TROPONIN I (HIGH SENSITIVITY)    EKG EKG Interpretation Date/Time:  Friday January 27 2024 11:13:42 EDT Ventricular Rate:  89 PR Interval:  134 QRS Duration:  82 QT Interval:  396 QTC Calculation: 481 R Axis:   -47  Text Interpretation: Normal sinus rhythm Left axis deviation Cannot rule out Anterior infarct , age undetermined Abnormal ECG When compared with ECG of 13-Feb-2020 14:31, PREVIOUS ECG IS PRESENT Confirmed by Estanislado Pandy [phone]) on 01/27/2024 12:51:56 PM  Radiology DG Chest 2 View Result Date: 01/27/2024 CLINICAL DATA:  Chest pain. EXAM: CHEST - 2 VIEW COMPARISON:  May 06, 2021. FINDINGS: The heart size and mediastinal contours are within normal limits. Both lungs are clear. The visualized skeletal structures are unremarkable. IMPRESSION: No active cardiopulmonary disease. Electronically Signed   By: Lupita Raider M.D.   On: 01/27/2024 12:52    Procedures .Laceration Repair  Date/Time: 01/27/2024 3:36 PM  Performed by: Coral Spikes, DO Authorized by: Coral Spikes, DO   Consent:    Consent obtained:  Verbal   Consent given by:  Patient   Risks discussed:  Infection, pain, retained foreign body, tendon damage, poor wound healing, poor cosmetic result and need for additional repair   Alternatives discussed:  No treatment Universal protocol:    Procedure explained and questions answered to patient or  proxy's satisfaction: yes     Patient identity confirmed:  Verbally with patient Anesthesia:    Anesthesia method:  Local infiltration   Local anesthetic:  Lidocaine 1% w/o epi Laceration details:    Location:  Face   Face location:  L eyebrow   Length (cm):  2 Pre-procedure details:    Preparation:  Patient was prepped and draped in usual sterile fashion Exploration:    Limited defect created (wound extended): no     Hemostasis achieved with:  Direct pressure   Imaging outcome: foreign body not noted     Wound extent: no  foreign body     Contaminated: no   Treatment:    Area cleansed with:  Saline   Amount of cleaning:  Standard     Medications Ordered in ED Medications - No data to display  ED Course/ Medical Decision Making/ A&P Clinical Course as of 01/27/24 1557  Fri Jan 27, 2024  1303 Bedside ultrasound of left eye with no obvious lens dislocation.  Posterior chamber without obvious hemorrhage.  Does not appear to have obvious vitreous or retinal detachment. [TY]  1422 [id number] Dr. Algernon Huxley. F/u mon @9 .  [TY]    Clinical Course User Index [TY] Coral Spikes, DO                                Medical Decision Making This is a 58 year old female presenting emergency department with flashes and floaters in her left eye, as well as some intermittent chest pain.  She is afebrile nontachycardic, slightly hypertensive on arrival, normalized without intervention.  EKG on my independent interpretation without ST segment changes to indicate ischemia.  Troponins negative x 2.  ACS unlikely.  Low risk for PE based on Wells criteria.  Chest x-ray without pneumonia pneumothorax or widened mediastinum on my independent interpretation.  She has no significant leukocytosis to suggest systemic infection.  No significant metabolic derangements.  Case discussed with ophthalmology, see ED course.  Plan for outpatient follow-up for likely vitreous detachment.  Stable for discharge at this  time.  Amount and/or Complexity of Data Reviewed Independent Historian:     Details: Family member notes patient quite stressed with taking care of her mother.  Notes that she had some return of symptoms when her mother called. External Data Reviewed:     Details: Last echo with normal EF Labs: ordered. Decision-making details documented in ED Course. Radiology: ordered. Decision-making details documented in ED Course.  Risk Decision regarding hospitalization.         Final Clinical Impression(s) / ED Diagnoses Final diagnoses:  Vitreous detachment of left eye    Rx / DC Orders ED Discharge Orders     None         Coral Spikes, DO 01/27/24 1557

## 2024-02-09 ENCOUNTER — Other Ambulatory Visit: Payer: Self-pay | Admitting: Adult Health

## 2024-02-28 ENCOUNTER — Other Ambulatory Visit: Payer: Self-pay | Admitting: Adult Health

## 2024-03-01 HISTORY — PX: OTHER SURGICAL HISTORY: SHX169

## 2024-03-06 ENCOUNTER — Other Ambulatory Visit: Payer: Self-pay | Admitting: Adult Health

## 2024-03-17 ENCOUNTER — Other Ambulatory Visit: Payer: Self-pay

## 2024-03-17 ENCOUNTER — Emergency Department (HOSPITAL_COMMUNITY)
Admission: EM | Admit: 2024-03-17 | Discharge: 2024-03-17 | Disposition: A | Discharge status: Home / Self Care | Attending: Emergency Medicine | Admitting: Emergency Medicine

## 2024-03-17 ENCOUNTER — Emergency Department (HOSPITAL_COMMUNITY)

## 2024-03-17 ENCOUNTER — Encounter (HOSPITAL_COMMUNITY): Payer: Self-pay | Admitting: Emergency Medicine

## 2024-03-17 DIAGNOSIS — M5442 Lumbago with sciatica, left side: Secondary | ICD-10-CM | POA: Insufficient documentation

## 2024-03-17 DIAGNOSIS — Z79899 Other long term (current) drug therapy: Secondary | ICD-10-CM | POA: Insufficient documentation

## 2024-03-17 DIAGNOSIS — I10 Essential (primary) hypertension: Secondary | ICD-10-CM | POA: Insufficient documentation

## 2024-03-17 DIAGNOSIS — M545 Low back pain, unspecified: Secondary | ICD-10-CM | POA: Diagnosis present

## 2024-03-17 MED ORDER — ACETAMINOPHEN 500 MG PO TABS
1000.0000 mg | ORAL_TABLET | Freq: Once | ORAL | Status: AC
  Administered 2024-03-17: 1000 mg via ORAL
  Filled 2024-03-17: qty 2

## 2024-03-17 MED ORDER — LIDOCAINE 5 % EX PTCH
1.0000 | MEDICATED_PATCH | CUTANEOUS | Status: DC
  Administered 2024-03-17: 1 via TRANSDERMAL
  Filled 2024-03-17: qty 1

## 2024-03-17 MED ORDER — LIDOCAINE 5 % EX OINT: 1.0000 | TOPICAL_OINTMENT | Freq: Four times a day (QID) | CUTANEOUS | 0 refills | Status: AC | PRN

## 2024-03-17 MED ORDER — ONDANSETRON 4 MG PO TBDP
4.0000 mg | ORAL_TABLET | Freq: Once | ORAL | Status: AC
  Administered 2024-03-17: 4 mg via ORAL
  Filled 2024-03-17: qty 1

## 2024-03-17 MED ORDER — OXYCODONE HCL 5 MG PO TABS
5.0000 mg | ORAL_TABLET | Freq: Once | ORAL | Status: AC
  Administered 2024-03-17: 5 mg via ORAL
  Filled 2024-03-17: qty 1

## 2024-03-17 MED ORDER — KETOROLAC TROMETHAMINE 15 MG/ML IJ SOLN
15.0000 mg | Freq: Once | INTRAMUSCULAR | Status: AC
  Administered 2024-03-17: 15 mg via INTRAMUSCULAR
  Filled 2024-03-17: qty 1

## 2024-03-17 NOTE — ED Triage Notes (Signed)
 Pt reports lower back pain that radiates down left leg since last Sunday. Pt saw neurosurgery on 5/14 and given medications (steroid and muscle relaxer). Pt woke up with worse pain. Due to have MRI follow-up but they are not scheduled yet.

## 2024-03-17 NOTE — Discharge Instructions (Addendum)
 It was a pleasure caring for you today.  X-ray today was without any fractures or dislocations or any other acute concern.  Please follow-up with your primary care provider.  Seek emergency care if experiencing any new or worsening symptoms.  Alternating between 650 mg Tylenol and 400 mg Advil: The best way to alternate taking Acetaminophen (example Tylenol) and Ibuprofen (example Advil/Motrin) is to take them 3 hours apart. For example, if you take ibuprofen at 6 am you can then take Tylenol at 9 am. You can continue this regimen throughout the day, making sure you do not exceed the recommended maximum dose for each drug.

## 2024-03-17 NOTE — ED Provider Notes (Signed)
 [place] EMERGENCY DEPARTMENT AT [facility] Provider Note   CSN: [id number] Arrival date & time: 03/17/24  1256     History  Chief Complaint  Patient presents with   Back Pain    Alicia Gross is a 58 y.o. female with PMHx HLD, anxiety, depression, HTN, who presents to ED concern for lower left-sided back pain with left-sided sciatica x2 weeks.  Patient has been following up with her outpatient spine doctor who was attempting to schedule her for MRI.  Patient's last dose of pain medicine was at 11:40AM with 5Mg  hydrocodone and 10Mg  Flexeril .  Patient also stating that she was recently placed on a steroid pack.  Patient stating that she had hip x-rays taken last week but does not have those results yet.  Patient states that she is able to ambulate.  Patient denies urinary retention, fecal incontinence, saddle anesthesia, lower extremity weakness, hx of cancer, fever, immunosuppression, IVDU, spinal procedure, significant trauma.  Denies vomiting, diarrhea, dysuria, hematuria, hematochezia.   Back Pain      Home Medications Prior to Admission medications   Medication Sig Start Date End Date Taking? Authorizing Provider  albuterol (VENTOLIN HFA) 108 (90 Base) MCG/ACT inhaler SMARTSIG:2 Puff(s) Via Inhaler 4 Times Daily PRN 01/05/22   [provider]  ALPRAZolam  (XANAX ) 1 MG tablet TAKE 1/2 TO 1 TABLET BY MOUTH EVERY 8 HOURS AS NEEDED FOR ANXIETY 02/09/24   Lendia Quay A, NP  ascorbic acid (VITAMIN C) 500 MG tablet Take 500 mg by mouth.    [provider]  cetirizine (ZYRTEC) 10 MG tablet Take 10 mg by mouth.    [provider]  Cholecalciferol (VITAMIN D3) 50 MCG (2000 UT) capsule Take 2,000 Units by mouth daily.     [provider]  cyclobenzaprine  (FLEXERIL ) 10 MG tablet Take 10 mg by mouth 3 (three) times daily as needed for muscle spasms.     [provider]  diphenhydramine-acetaminophen (TYLENOL PM) 25-500 MG TABS  tablet Take 1 tablet by mouth at bedtime as needed (sleep).    [provider]  escitalopram  (LEXAPRO ) 20 MG tablet Take 1 tablet (20 mg total) by mouth daily. 06/15/23   Javan Messing, NP  FLOVENT [facility] 110 MCG/ACT inhaler SMARTSIG:By Mouth 01/19/22   [provider]  gabapentin (NEURONTIN) 300 MG capsule Take 300 mg by mouth 3 (three) times daily. 04/29/22   [provider]  hydrochlorothiazide  (HYDRODIURIL ) 50 MG tablet TAKE 1 TABLET BY MOUTH ONCE DAILY 08/03/23   Lendia Quay A, NP  HYDROcodone-acetaminophen (NORCO/VICODIN) 5-325 MG tablet Take 1 tablet by mouth every 6 (six) hours as needed. 03/18/21   [provider]  Magnesium 500 MG TABS Take 500 mg by mouth daily.     [provider]  Multiple Vitamin (MULTIVITAMIN) tablet Take 1 tablet by mouth daily.    [provider]  ondansetron  (ZOFRAN -ODT) 4 MG disintegrating tablet Take 1 tablet (4 mg total) by mouth every 8 (eight) hours as needed for nausea or vomiting. 06/15/23   Javan Messing, NP  Red Yeast Rice Extract (RED YEAST RICE PO) Take by mouth.    [provider]  scopolamine  (TRANSDERM-SCOP) 1 MG/3DAYS apply 24 hours BEFORE cruise, can REPEAT in 3 DAYS if needed 03/06/24   Javan Messing, NP      Allergies    Patient has no known allergies.    Review of Systems   Review of Systems  Musculoskeletal:  Positive for back pain.  Physical Exam Updated Vital Signs BP (!) 125/95 (BP Location: Right Arm)   Pulse 90   Temp 98.4 F (36.9 C) (Oral)   Resp (!) 22   Ht 5\' 7"  (1.702 m)   Wt 99 kg   SpO2 99%   BMI 34.18 kg/m  Physical Exam Vitals and nursing note reviewed.  Constitutional:      General: She is in acute distress.     Appearance: She is not ill-appearing or toxic-appearing.  HENT:     Head: Normocephalic and atraumatic.     Mouth/Throat:     Mouth: Mucous membranes are moist.  Eyes:     General: No scleral icterus.       Right eye:  No discharge.        Left eye: No discharge.     Conjunctiva/sclera: Conjunctivae normal.  Cardiovascular:     Rate and Rhythm: Normal rate and regular rhythm.     Pulses: Normal pulses.     Heart sounds: Normal heart sounds. No murmur heard. Pulmonary:     Effort: Pulmonary effort is normal. No respiratory distress.     Breath sounds: Normal breath sounds. No wheezing, rhonchi or rales.  Abdominal:     General: Abdomen is flat. Bowel sounds are normal. There is no distension.     Palpations: Abdomen is soft. There is no mass.     Tenderness: There is no abdominal tenderness.  Musculoskeletal:     Right lower leg: No edema.     Left lower leg: No edema.     Comments: Left lumbar paraspinal muscles tender to palpation.  +2 pedal pulse.  Area nontense.  No saddle paresthesia.  Skin:    General: Skin is warm and dry.     Findings: No rash.  Neurological:     General: No focal deficit present.     Mental Status: She is alert. Mental status is at baseline.  Psychiatric:        Mood and Affect: Mood normal.     ED Results / Procedures / Treatments   Labs (all labs ordered are listed, but only abnormal results are displayed) Labs Reviewed - No data to display  EKG None  Radiology DG Hip Unilat W or Wo Pelvis 2-3 Views Left Result Date: 03/17/2024 CLINICAL DATA:  Lower back pain radiating down left leg EXAM: DG HIP (WITH OR WITHOUT PELVIS) 2-3V LEFT COMPARISON:  03/14/2024 FINDINGS: Frontal view of the pelvis as well as frontal and frogleg lateral views of the left hip are obtained. No acute fracture, subluxation, or dislocation. Stable mild symmetrical bilateral hip osteoarthritis. The sacroiliac joints are unremarkable. Stable lower lumbar spondylosis and facet hypertrophy. IMPRESSION: 1. Stable degenerative changes of the lumbar spine and bilateral hips. No acute bony abnormality. Electronically Signed   By: Bobbye Burrow M.D.   On: 03/17/2024 14:29    Procedures Procedures     Medications Ordered in ED Medications  lidocaine (LIDODERM) 5 % 1 patch (1 patch Transdermal Patch Applied 03/17/24 1424)  acetaminophen (TYLENOL) tablet 1,000 mg (1,000 mg Oral Given 03/17/24 1420)  ketorolac (TORADOL) 15 MG/ML injection 15 mg (15 mg Intramuscular Given 03/17/24 1421)  oxyCODONE  (Oxy IR/ROXICODONE ) immediate release tablet 5 mg (5 mg Oral Given 03/17/24 1421)  ondansetron  (ZOFRAN -ODT) disintegrating tablet 4 mg (4 mg Oral Given 03/17/24 1437)    ED Course/ Medical Decision Making/ A&P  Medical Decision Making Amount and/or Complexity of Data Reviewed Radiology: ordered.  Risk OTC drugs. Prescription drug management.   This patient presents to the ED for concern of back pain, this involves an extensive number of treatment options, and is a complaint that carries with it a high risk of complications and morbidity.  The differential diagnosis includes pyelonephritis, nephrolithiasis, spinal abscess, osteomyelitis, herniated disc, muscle strain, spinal fracture, meningitis, cancer, cauda equina syndrome.   Co morbidities that complicate the patient evaluation  HLD, anxiety, depression, HTN   Additional history obtained:  Dr. Broadus Canes PCP    Problem List / ED Course / Critical interventions / Medication management  Patient presents for low back pain.  Physical exam with lumbar paraspinal muscles tender to palpation.  No midline tenderness to palpation.  Patient stating that we do not need to run urine or any other labs today because she knows that this pain is from her herniated disks.  Since patient does not have her hip x-ray back from her PCP yet, I recommended hip x-rays here while she is obtaining her back pain cocktail.  Patient agreed to this. Patient asking for MRI.  Educated patient that she does not meet criteria for emergent MRI today. I ordered imaging studies including left hip x-ray. I independently visualized and  interpreted imaging which showed no acute process. I agree with the radiologist interpretation Patient denies urinary retention, fecal incontinence, saddle anesthesia, lower extremity weakness, hx of cancer, fever, immunosuppression, IVDU, spinal procedure, significant trauma. Will proceed with conservative therapy and have patient follow up with PCP.  Patient verbalized understanding of plan. I have reviewed the patients home medicines and have made adjustments as needed The patient has been appropriately medically screened and/or stabilized in the ED. I have low suspicion for any other emergent medical condition which would require further screening, evaluation or treatment in the ED or require inpatient management. At time of discharge the patient is hemodynamically stable and in no acute distress. I have discussed work-up results and diagnosis with patient and answered all questions. Patient is agreeable with discharge plan. We discussed strict return precautions for returning to the emergency department and they verbalized understanding.     Social Determinants of Health:  none         Final Clinical Impression(s) / ED Diagnoses Final diagnoses:  Acute left-sided low back pain with left-sided sciatica    Rx / DC Orders ED Discharge Orders     None         [place], [place] 03/17/24 1504    Tegeler, Marine Sia, MD 03/17/24 [phone]

## 2024-03-22 ENCOUNTER — Other Ambulatory Visit (HOSPITAL_COMMUNITY): Payer: Self-pay | Admitting: Neurosurgery

## 2024-03-22 DIAGNOSIS — M4316 Spondylolisthesis, lumbar region: Secondary | ICD-10-CM

## 2024-03-23 ENCOUNTER — Ambulatory Visit (HOSPITAL_COMMUNITY)
Admission: RE | Admit: 2024-03-23 | Discharge: 2024-03-23 | Disposition: A | Discharge status: Home / Self Care | Source: Ambulatory Visit | Attending: Neurosurgery | Admitting: Neurosurgery

## 2024-03-23 DIAGNOSIS — M4316 Spondylolisthesis, lumbar region: Secondary | ICD-10-CM | POA: Insufficient documentation

## 2024-04-26 HISTORY — PX: BACK SURGERY: SHX140

## 2024-07-01 ENCOUNTER — Other Ambulatory Visit: Payer: Self-pay | Admitting: Adult Health

## 2024-07-18 ENCOUNTER — Other Ambulatory Visit (HOSPITAL_COMMUNITY): Payer: Self-pay | Admitting: Adult Health

## 2024-07-18 DIAGNOSIS — Z1231 Encounter for screening mammogram for malignant neoplasm of breast: Secondary | ICD-10-CM

## 2024-07-19 ENCOUNTER — Ambulatory Visit (HOSPITAL_COMMUNITY)
Admission: RE | Admit: 2024-07-19 | Discharge: 2024-07-19 | Disposition: A | Discharge status: Home / Self Care | Source: Ambulatory Visit | Attending: Adult Health | Admitting: Adult Health

## 2024-07-19 ENCOUNTER — Encounter (HOSPITAL_COMMUNITY): Payer: Self-pay

## 2024-07-19 DIAGNOSIS — Z1231 Encounter for screening mammogram for malignant neoplasm of breast: Secondary | ICD-10-CM | POA: Insufficient documentation

## 2024-07-23 ENCOUNTER — Ambulatory Visit: Payer: Self-pay | Admitting: Adult Health

## 2024-08-09 ENCOUNTER — Other Ambulatory Visit: Payer: Self-pay | Admitting: Adult Health

## 2024-09-13 ENCOUNTER — Other Ambulatory Visit: Payer: Self-pay | Admitting: Adult Health

## 2024-10-01 ENCOUNTER — Encounter: Payer: Self-pay | Admitting: Adult Health

## 2024-10-01 ENCOUNTER — Ambulatory Visit: Admitting: Adult Health

## 2024-10-01 VITALS — BP 117/81 | HR 106 | Ht 67.0 in | Wt 214.0 lb

## 2024-10-01 DIAGNOSIS — Z01419 Encounter for gynecological examination (general) (routine) without abnormal findings: Secondary | ICD-10-CM | POA: Diagnosis not present

## 2024-10-01 DIAGNOSIS — F32A Depression, unspecified: Secondary | ICD-10-CM

## 2024-10-01 DIAGNOSIS — M25552 Pain in left hip: Secondary | ICD-10-CM | POA: Insufficient documentation

## 2024-10-01 DIAGNOSIS — F418 Other specified anxiety disorders: Secondary | ICD-10-CM | POA: Diagnosis not present

## 2024-10-01 DIAGNOSIS — Z90710 Acquired absence of both cervix and uterus: Secondary | ICD-10-CM

## 2024-10-01 DIAGNOSIS — R232 Flushing: Secondary | ICD-10-CM | POA: Diagnosis not present

## 2024-10-01 NOTE — Progress Notes (Signed)
 Patient ID: Shaunna RAMAN Baldi, female   DOB: Oct 11, 1966, 58 y.o.   MRN: [id number] History of Present Illness: Cristella is a 58 year old white female, married, sp hysterectomy in for a well woman gyn exam. She has had back surgery and still having issues, and is out of working, waiting on getting MRI approved. She has pain in left hip if walks over 20 minutes, it is hard to sit,stand or lay down. Has occasional chest pain. She is having hot flashes too.  PCP is Dr Trudy     Current Medications, Allergies, Past Medical History, Past Surgical History, Family History and Social History were reviewed in [employer] electronic medical record.     Review of Systems: Patient denies any headaches, hearing loss, fatigue, blurred vision, shortness of breath, abdominal pain, problems with bowel movements, urination, or intercourse. No joint pain or mood swings.  See HPI for positives    Physical Exam:BP 117/81 (BP Location: Left Arm, Patient Position: Sitting, Cuff Size: Large)   Pulse (!) 106   Ht 5' 7 (1.702 m)   Wt 214 lb (97.1 kg)   BMI 33.52 kg/m   General:  Well developed, well nourished, no acute distress Skin:  Warm and dry Neck:  Midline trachea, normal thyroid, good ROM, no lymphadenopathy, no carotid bruits heard  Lungs; Clear to auscultation bilaterally Breast:  No dominant palpable mass, retraction, or nipple discharge Cardiovascular: Regular rate and rhythm Abdomen:  Soft, non tender, no hepatosplenomegaly Pelvic:  External genitalia is normal in appearance, no lesions.  The vagina is pale. Urethra has no lesions or masses. The cervix and uterus are absent.  No adnexal masses or tenderness noted.Bladder is non tender, no masses felt. Rectal: Deferred Extremities/musculoskeletal:  No swelling or varicosities noted, no clubbing or cyanosis Psych:  No mood changes, alert and cooperative,seems happy  AA is 0 Fall risk is low    10/01/2024    1:46 PM 06/15/2023    2:34 PM  05/05/2022    8:31 AM  Depression screen PHQ 2/9  Decreased Interest 0 0 0  Down, Depressed, Hopeless 1 0 0  PHQ - 2 Score 1 0 0  Altered sleeping 1 1 1   Tired, decreased energy 1 1 2   Change in appetite 0 1 1  Feeling bad or failure about yourself  0 0 0  Trouble concentrating 0 1 0  Moving slowly or fidgety/restless 0 0 0  Suicidal thoughts 0 0 0  PHQ-9 Score 3 4  4       Data saved with a previous flowsheet row definition       10/01/2024    1:47 PM 06/15/2023    2:34 PM 05/05/2022    8:32 AM 04/17/2021    8:42 AM  GAD 7 : Generalized Anxiety Score  Nervous, Anxious, on Edge 0 1 1 0  Control/stop worrying 1 1 0 0  Worry too much - different things 1 1 0 0  Trouble relaxing 0 0 0 0  Restless 0 0 0 0  Easily annoyed or irritable 0 0 0 0  Afraid - awful might happen 0 0 0 0  Total GAD 7 Score 2 3 1  0    Upstream - 10/01/24 1344       Pregnancy Intention Screening   Does the patient want to become pregnant in the next year? N/A    Does the patient's partner want to become pregnant in the next year? N/A    Would the patient  like to discuss contraceptive options today? N/A      Contraception Wrap Up   Current Method Hysterectomy    End Method Hysterectomy    Contraception Counseling Provided No         Examination chaperoned by Clarita Salt LPN    Impression and plan: 1. Encounter for well woman exam with routine gynecological exam (Primary) Physical in 1 year Mammogram was negative 07/19/24 Colonoscopy per [facility] with PCP   2. S/P hysterectomy  3. Anxiety and depression She is on lexapro  20 mg 1 daily  and has xanax  prn  Has refills   4. Hot flashes She is on Neurontin at bedtime  5. Pain of left hip ?related to back, awaiting on MRI back to be approved

## 2024-11-07 ENCOUNTER — Other Ambulatory Visit: Payer: Self-pay | Admitting: Adult Health

## 2024-11-07 ENCOUNTER — Other Ambulatory Visit (HOSPITAL_COMMUNITY): Payer: Self-pay | Admitting: Neurosurgery

## 2024-11-07 DIAGNOSIS — M5127 Other intervertebral disc displacement, lumbosacral region: Secondary | ICD-10-CM

## 2024-11-28 ENCOUNTER — Ambulatory Visit (HOSPITAL_COMMUNITY)
Admission: RE | Admit: 2024-11-28 | Discharge: 2024-11-28 | Disposition: A | Discharge status: Home / Self Care | Source: Ambulatory Visit | Attending: Neurosurgery | Admitting: Neurosurgery

## 2024-11-28 DIAGNOSIS — M5127 Other intervertebral disc displacement, lumbosacral region: Secondary | ICD-10-CM | POA: Insufficient documentation

## 2024-11-28 MED ORDER — GADOBUTROL 1 MMOL/ML IV SOLN
10.0000 mL | Freq: Once | INTRAVENOUS | Status: AC | PRN
  Administered 2024-11-28: 10 mL via INTRAVENOUS

## 2024-12-05 ENCOUNTER — Other Ambulatory Visit: Payer: Self-pay | Admitting: Adult Health
